# Patient Record
Sex: Male | Born: 1960
Health system: Southern US, Community
[De-identification: ages and names within clinical notes are randomized; demographics above are authoritative.]

## PROBLEM LIST (undated history)

## (undated) DIAGNOSIS — R011 Cardiac murmur, unspecified: Secondary | ICD-10-CM

## (undated) DIAGNOSIS — I10 Essential (primary) hypertension: Secondary | ICD-10-CM

## (undated) DIAGNOSIS — G709 Myoneural disorder, unspecified: Secondary | ICD-10-CM

## (undated) DIAGNOSIS — N529 Male erectile dysfunction, unspecified: Secondary | ICD-10-CM

## (undated) DIAGNOSIS — C801 Malignant (primary) neoplasm, unspecified: Secondary | ICD-10-CM

## (undated) DIAGNOSIS — R350 Frequency of micturition: Secondary | ICD-10-CM

## (undated) DIAGNOSIS — S83207A Unspecified tear of unspecified meniscus, current injury, left knee, initial encounter: Secondary | ICD-10-CM

## (undated) DIAGNOSIS — R351 Nocturia: Secondary | ICD-10-CM

## (undated) DIAGNOSIS — K649 Unspecified hemorrhoids: Secondary | ICD-10-CM

## (undated) DIAGNOSIS — E7800 Pure hypercholesterolemia, unspecified: Secondary | ICD-10-CM

## (undated) HISTORY — DX: Pure hypercholesterolemia, unspecified: E78.00

## (undated) HISTORY — PX: TONSILLECTOMY: SUR1361

## (undated) HISTORY — PX: TAYLOR BUNIONECTOMY: SHX2485

---
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
## 2016-02-14 DIAGNOSIS — E785 Hyperlipidemia, unspecified: Secondary | ICD-10-CM | POA: Diagnosis not present

## 2016-02-14 DIAGNOSIS — R972 Elevated prostate specific antigen [PSA]: Secondary | ICD-10-CM | POA: Diagnosis not present

## 2016-03-24 DIAGNOSIS — K080 Exfoliation of teeth due to systemic causes: Secondary | ICD-10-CM | POA: Diagnosis not present

## 2016-04-21 DIAGNOSIS — R972 Elevated prostate specific antigen [PSA]: Secondary | ICD-10-CM | POA: Diagnosis not present

## 2016-06-01 DIAGNOSIS — Z8546 Personal history of malignant neoplasm of prostate: Secondary | ICD-10-CM

## 2016-06-01 HISTORY — DX: Personal history of malignant neoplasm of prostate: Z85.46

## 2016-06-02 DIAGNOSIS — R972 Elevated prostate specific antigen [PSA]: Secondary | ICD-10-CM | POA: Diagnosis not present

## 2016-06-02 DIAGNOSIS — C61 Malignant neoplasm of prostate: Secondary | ICD-10-CM | POA: Diagnosis not present

## 2016-06-12 DIAGNOSIS — C61 Malignant neoplasm of prostate: Secondary | ICD-10-CM | POA: Diagnosis not present

## 2016-06-12 DIAGNOSIS — R972 Elevated prostate specific antigen [PSA]: Secondary | ICD-10-CM | POA: Diagnosis not present

## 2016-06-19 ENCOUNTER — Other Ambulatory Visit: Payer: Self-pay | Admitting: Urology

## 2016-06-19 NOTE — Progress Notes (Signed)
Scheduling pre op- please place SURGICAL ORDERS in epic  thanks

## 2016-06-21 MED ORDER — MAGNESIUM CITRATE PO SOLN: 1.0000 | Freq: Once | ORAL | Status: AC

## 2016-07-29 ENCOUNTER — Encounter (HOSPITAL_COMMUNITY)
Admission: RE | Admit: 2016-07-29 | Discharge: 2016-07-29 | Disposition: A | Discharge status: Home / Self Care | Payer: Federal, State, Local not specified - PPO | Source: Ambulatory Visit | Attending: Urology | Admitting: Urology

## 2016-07-29 ENCOUNTER — Encounter (HOSPITAL_COMMUNITY): Payer: Self-pay

## 2016-07-29 DIAGNOSIS — I10 Essential (primary) hypertension: Secondary | ICD-10-CM | POA: Diagnosis not present

## 2016-07-29 DIAGNOSIS — M7989 Other specified soft tissue disorders: Secondary | ICD-10-CM | POA: Diagnosis not present

## 2016-07-29 DIAGNOSIS — M6281 Muscle weakness (generalized): Secondary | ICD-10-CM | POA: Diagnosis not present

## 2016-07-29 DIAGNOSIS — C61 Malignant neoplasm of prostate: Secondary | ICD-10-CM | POA: Diagnosis not present

## 2016-07-29 DIAGNOSIS — Z8042 Family history of malignant neoplasm of prostate: Secondary | ICD-10-CM | POA: Diagnosis not present

## 2016-07-29 HISTORY — DX: Cardiac murmur, unspecified: R01.1

## 2016-07-29 HISTORY — DX: Essential (primary) hypertension: I10

## 2016-07-29 HISTORY — DX: Malignant (primary) neoplasm, unspecified: C80.1

## 2016-07-29 HISTORY — DX: Frequency of micturition: R35.0

## 2016-07-29 HISTORY — DX: Unspecified hemorrhoids: K64.9

## 2016-07-29 HISTORY — DX: Myoneural disorder, unspecified: G70.9

## 2016-07-29 LAB — BASIC METABOLIC PANEL
Anion gap: 7 (ref 5–15)
BUN: 18 mg/dL (ref 6–20)
CHLORIDE: 103 mmol/L (ref 101–111)
CO2: 29 mmol/L (ref 22–32)
CREATININE: 1.32 mg/dL — AB (ref 0.61–1.24)
Calcium: 9.5 mg/dL (ref 8.9–10.3)
GFR calc Af Amer: >60 mL/min (ref >60)
GFR calc non Af Amer: 60 mL/min — ABNORMAL LOW (ref >60)
GLUCOSE: 74 mg/dL (ref 65–99)
POTASSIUM: 3.2 mmol/L — AB (ref 3.5–5.1)
SODIUM: 139 mmol/L (ref 135–145)

## 2016-07-29 LAB — CBC
HEMATOCRIT: 41.6 % (ref 39.0–52.0)
Hemoglobin: 14.5 g/dL (ref 13.0–17.0)
MCH: 29.7 pg (ref 26.0–34.0)
MCHC: 34.9 g/dL (ref 30.0–36.0)
MCV: 85.2 fL (ref 78.0–100.0)
PLATELETS: 191 10*3/uL (ref 150–400)
RBC: 4.88 MIL/uL (ref 4.22–5.81)
RDW: 12.9 % (ref 11.5–15.5)
WBC: 7.3 10*3/uL (ref 4.0–10.5)

## 2016-07-29 LAB — ABO/RH: ABO/RH(D): O POS

## 2016-07-29 NOTE — Patient Instructions (Addendum)
Patrick Edwards  07/29/2016   Your procedure is scheduled on: 07-31-16  Report to [facility] Main  Entrance take [facility]  elevators to 3rd floor to  [place] at   0900 AM.  Call this number if you have problems the morning of surgery [phone]  Follow Bowel prep per MD instructions- Drink Clear liquids plentiful day of prep.   Remember: ONLY 1 PERSON MAY GO WITH YOU TO SHORT STAY TO GET  READY MORNING OF YOUR SURGERY.  Do not eat food or drink liquids :After Midnight.     Take these medicines the morning of surgery with A SIP OF WATER: none DO NOT TAKE ANY DIABETIC MEDICATIONS DAY OF YOUR SURGERY                               You may not have any metal on your body including hair pins and              piercings  Do not wear jewelry, make-up, lotions, powders or perfumes, deodorant             Do not wear nail polish.  Do not shave  48 hours prior to surgery.              Men may shave face and neck.   Do not bring valuables to the hospital. [place].  Contacts, dentures or bridgework may not be worn into surgery.  Leave suitcase in the car. After surgery it may be brought to your room.     Patients discharged the day of surgery will not be allowed to drive home.  Name and phone number of your driver: [phone]  Special Instructions: N/A              Please read over the following fact sheets you were given: _____________________________________________________________________             [facility] - Preparing for Surgery Before surgery, you can play an important role.  Because skin is not sterile, your skin needs to be as free of germs as possible.  You can reduce the number of germs on your skin by washing with CHG (chlorahexidine gluconate) soap before surgery.  CHG is an antiseptic cleaner which kills germs and bonds with the skin to continue killing germs even after  washing. Please DO NOT use if you have an allergy to CHG or antibacterial soaps.  If your skin becomes reddened/irritated stop using the CHG and inform your nurse when you arrive at Short Stay. Do not shave (including legs and underarms) for at least 48 hours prior to the first CHG shower.  You may shave your face/neck. Please follow these instructions carefully:  1.  Shower with CHG Soap the night before surgery and the  morning of Surgery.  2.  If you choose to wash your hair, wash your hair first as usual with your  normal  shampoo.  3.  After you shampoo, rinse your hair and body thoroughly to remove the  shampoo.                           4.  Use CHG as you  would any other liquid soap.  You can apply chg directly  to the skin and wash                       Gently with a scrungie or clean washcloth.  5.  Apply the CHG Soap to your body ONLY FROM THE NECK DOWN.   Do not use on face/ open                           Wound or open sores. Avoid contact with eyes, ears mouth and genitals (private parts).                       Wash face,  Genitals (private parts) with your normal soap.             6.  Wash thoroughly, paying special attention to the area where your surgery  will be performed.  7.  Thoroughly rinse your body with warm water from the neck down.  8.  DO NOT shower/wash with your normal soap after using and rinsing off  the CHG Soap.                9.  Pat yourself dry with a clean towel.            10.  Wear clean pajamas.            11.  Place clean sheets on your bed the night of your first shower and do not  sleep with pets. Day of Surgery : Do not apply any lotions/deodorants the morning of surgery.  Please wear clean clothes to the hospital/surgery center.  FAILURE TO FOLLOW THESE INSTRUCTIONS MAY RESULT IN THE CANCELLATION OF YOUR SURGERY PATIENT SIGNATURE_________________________________  NURSE  SIGNATURE__________________________________  ________________________________________________________________________

## 2016-07-29 NOTE — Progress Notes (Signed)
07-29-16 1640 Note BMP, Potassium 3.2- pt taking Hydrochlorthaizide for Blood pressure and will be doing a bowel prep day before surgery.

## 2016-07-30 NOTE — Pre-Procedure Instructions (Signed)
EKG done 07-29-16.

## 2016-07-31 ENCOUNTER — Inpatient Hospital Stay (HOSPITAL_COMMUNITY): Payer: Federal, State, Local not specified - PPO | Admitting: Anesthesiology

## 2016-07-31 ENCOUNTER — Encounter (HOSPITAL_COMMUNITY)
Admission: RE | Disposition: A | Discharge status: Home / Self Care | Payer: Self-pay | Source: Ambulatory Visit | Attending: Urology

## 2016-07-31 ENCOUNTER — Inpatient Hospital Stay (HOSPITAL_COMMUNITY)
Admission: RE | Admit: 2016-07-31 | Discharge: 2016-08-03 | DRG: 708 | Disposition: A | Discharge status: Home / Self Care | Payer: Federal, State, Local not specified - PPO | Source: Ambulatory Visit | Attending: Urology | Admitting: Urology

## 2016-07-31 ENCOUNTER — Encounter (HOSPITAL_COMMUNITY): Payer: Self-pay | Admitting: *Deleted

## 2016-07-31 DIAGNOSIS — I10 Essential (primary) hypertension: Secondary | ICD-10-CM | POA: Diagnosis not present

## 2016-07-31 DIAGNOSIS — Z8042 Family history of malignant neoplasm of prostate: Secondary | ICD-10-CM

## 2016-07-31 DIAGNOSIS — C61 Malignant neoplasm of prostate: Secondary | ICD-10-CM | POA: Diagnosis not present

## 2016-07-31 DIAGNOSIS — M7989 Other specified soft tissue disorders: Secondary | ICD-10-CM | POA: Diagnosis not present

## 2016-07-31 HISTORY — PX: LYMPHADENECTOMY: SHX5960

## 2016-07-31 HISTORY — PX: ROBOT ASSISTED LAPAROSCOPIC RADICAL PROSTATECTOMY: SHX5141

## 2016-07-31 LAB — TYPE AND SCREEN
ABO/RH(D): O POS
Antibody Screen: NEGATIVE

## 2016-07-31 LAB — HEMOGLOBIN AND HEMATOCRIT, BLOOD
HEMATOCRIT: 42.4 % (ref 39.0–52.0)
HEMOGLOBIN: 14.2 g/dL (ref 13.0–17.0)

## 2016-07-31 SURGERY — PROSTATECTOMY, RADICAL, ROBOT-ASSISTED, LAPAROSCOPIC
Anesthesia: General

## 2016-07-31 MED ORDER — MIDAZOLAM HCL 2 MG/2ML IJ SOLN
INTRAMUSCULAR | Status: DC | PRN
  Administered 2016-07-31: 2 mg via INTRAVENOUS

## 2016-07-31 MED ORDER — HYDROCHLOROTHIAZIDE 25 MG PO TABS
25.0000 mg | ORAL_TABLET | Freq: Every day | ORAL | Status: DC
Start: 2016-07-31 — End: 2016-08-03
  Administered 2016-07-31 – 2016-08-03 (×4): 25 mg via ORAL
  Filled 2016-07-31 (×4): qty 1

## 2016-07-31 MED ORDER — METOCLOPRAMIDE HCL 5 MG/ML IJ SOLN: 10.0000 mg | Freq: Once | INTRAMUSCULAR | Status: DC | PRN

## 2016-07-31 MED ORDER — SODIUM CHLORIDE 0.9 % IR SOLN
Status: DC | PRN
  Administered 2016-07-31: 1000 mL

## 2016-07-31 MED ORDER — ACETAMINOPHEN 500 MG PO TABS
1000.0000 mg | ORAL_TABLET | Freq: Four times a day (QID) | ORAL | Status: AC
  Administered 2016-07-31 – 2016-08-01 (×4): 1000 mg via ORAL
  Filled 2016-07-31 (×4): qty 2

## 2016-07-31 MED ORDER — ONDANSETRON HCL 4 MG/2ML IJ SOLN
INTRAMUSCULAR | Status: DC | PRN
  Administered 2016-07-31: 4 mg via INTRAVENOUS

## 2016-07-31 MED ORDER — ROCURONIUM BROMIDE 50 MG/5ML IV SOSY
PREFILLED_SYRINGE | INTRAVENOUS | Status: AC
  Filled 2016-07-31: qty 5

## 2016-07-31 MED ORDER — DEXAMETHASONE SODIUM PHOSPHATE 10 MG/ML IJ SOLN
INTRAMUSCULAR | Status: DC | PRN
  Administered 2016-07-31: 10 mg via INTRAVENOUS

## 2016-07-31 MED ORDER — SUCCINYLCHOLINE CHLORIDE 200 MG/10ML IV SOSY
PREFILLED_SYRINGE | INTRAVENOUS | Status: AC
  Filled 2016-07-31: qty 10

## 2016-07-31 MED ORDER — SODIUM CHLORIDE 0.9 % IJ SOLN
INTRAMUSCULAR | Status: AC
  Filled 2016-07-31: qty 50

## 2016-07-31 MED ORDER — ACETAMINOPHEN 10 MG/ML IV SOLN
1000.0000 mg | Freq: Once | INTRAVENOUS | Status: AC
  Administered 2016-07-31: 1000 mg via INTRAVENOUS

## 2016-07-31 MED ORDER — HYDROMORPHONE HCL 1 MG/ML IJ SOLN
0.5000 mg | INTRAMUSCULAR | Status: DC | PRN
Start: 2016-07-31 — End: 2016-08-03
  Administered 2016-07-31: 0.5 mg via INTRAVENOUS
  Administered 2016-07-31 – 2016-08-01 (×6): 1 mg via INTRAVENOUS
  Administered 2016-08-02: 0.5 mg via INTRAVENOUS
  Filled 2016-07-31 (×5): qty 1
  Filled 2016-07-31: qty 0.5
  Filled 2016-07-31 (×3): qty 1

## 2016-07-31 MED ORDER — MIDAZOLAM HCL 2 MG/2ML IJ SOLN
INTRAMUSCULAR | Status: AC
  Filled 2016-07-31: qty 2

## 2016-07-31 MED ORDER — SUCCINYLCHOLINE CHLORIDE 200 MG/10ML IV SOSY
PREFILLED_SYRINGE | INTRAVENOUS | Status: DC | PRN
  Administered 2016-07-31: 120 mg via INTRAVENOUS

## 2016-07-31 MED ORDER — DEXTROSE-NACL 5-0.45 % IV SOLN
INTRAVENOUS | Status: DC
Start: 2016-07-31 — End: 2016-08-01
  Administered 2016-07-31 – 2016-08-01 (×2): via INTRAVENOUS

## 2016-07-31 MED ORDER — EPHEDRINE SULFATE 50 MG/ML IJ SOLN
INTRAMUSCULAR | Status: DC | PRN
  Administered 2016-07-31 (×2): 10 mg via INTRAVENOUS

## 2016-07-31 MED ORDER — SUGAMMADEX SODIUM 200 MG/2ML IV SOLN
INTRAVENOUS | Status: DC | PRN
  Administered 2016-07-31: 200 mg via INTRAVENOUS

## 2016-07-31 MED ORDER — PROPOFOL 10 MG/ML IV BOLUS
INTRAVENOUS | Status: DC | PRN
  Administered 2016-07-31: 150 mg via INTRAVENOUS

## 2016-07-31 MED ORDER — DEXAMETHASONE SODIUM PHOSPHATE 10 MG/ML IJ SOLN
INTRAMUSCULAR | Status: AC
  Filled 2016-07-31: qty 1

## 2016-07-31 MED ORDER — LACTATED RINGERS IV SOLN: INTRAVENOUS | Status: DC

## 2016-07-31 MED ORDER — ONDANSETRON HCL 4 MG/2ML IJ SOLN
INTRAMUSCULAR | Status: AC
  Filled 2016-07-31: qty 2

## 2016-07-31 MED ORDER — PHENYLEPHRINE 40 MCG/ML (10ML) SYRINGE FOR IV PUSH (FOR BLOOD PRESSURE SUPPORT)
PREFILLED_SYRINGE | INTRAVENOUS | Status: AC
  Filled 2016-07-31: qty 10

## 2016-07-31 MED ORDER — SUGAMMADEX SODIUM 200 MG/2ML IV SOLN
INTRAVENOUS | Status: AC
  Filled 2016-07-31: qty 2

## 2016-07-31 MED ORDER — LIDOCAINE 2% (20 MG/ML) 5 ML SYRINGE
INTRAMUSCULAR | Status: AC
  Filled 2016-07-31: qty 5

## 2016-07-31 MED ORDER — ACETAMINOPHEN 10 MG/ML IV SOLN
INTRAVENOUS | Status: AC
  Filled 2016-07-31: qty 100

## 2016-07-31 MED ORDER — FENTANYL CITRATE (PF) 100 MCG/2ML IJ SOLN
INTRAMUSCULAR | Status: AC
  Filled 2016-07-31: qty 2

## 2016-07-31 MED ORDER — FENTANYL CITRATE (PF) 250 MCG/5ML IJ SOLN
INTRAMUSCULAR | Status: AC
  Filled 2016-07-31: qty 5

## 2016-07-31 MED ORDER — LACTATED RINGERS IV SOLN
INTRAVENOUS | Status: DC
  Administered 2016-07-31: 1000 mL via INTRAVENOUS
  Administered 2016-07-31 (×2): via INTRAVENOUS

## 2016-07-31 MED ORDER — SODIUM CHLORIDE 0.9 % IJ SOLN
INTRAMUSCULAR | Status: DC | PRN
  Administered 2016-07-31: 40 mL

## 2016-07-31 MED ORDER — BUPIVACAINE LIPOSOME 1.3 % IJ SUSP
INTRAMUSCULAR | Status: DC | PRN
  Administered 2016-07-31: 20 mL

## 2016-07-31 MED ORDER — CEFAZOLIN SODIUM-DEXTROSE 2-4 GM/100ML-% IV SOLN
2.0000 g | INTRAVENOUS | Status: AC
  Administered 2016-07-31: 2 g via INTRAVENOUS
  Filled 2016-07-31: qty 100

## 2016-07-31 MED ORDER — DIPHENHYDRAMINE HCL 50 MG/ML IJ SOLN: 12.5000 mg | Freq: Four times a day (QID) | INTRAMUSCULAR | Status: DC | PRN

## 2016-07-31 MED ORDER — OXYCODONE HCL 5 MG PO TABS
5.0000 mg | ORAL_TABLET | ORAL | Status: DC | PRN
  Administered 2016-08-01 – 2016-08-03 (×8): 5 mg via ORAL
  Filled 2016-07-31 (×8): qty 1

## 2016-07-31 MED ORDER — SODIUM CHLORIDE 0.9 % IV BOLUS (SEPSIS)
1000.0000 mL | Freq: Once | INTRAVENOUS | Status: AC
  Administered 2016-07-31: 1000 mL via INTRAVENOUS

## 2016-07-31 MED ORDER — MEPERIDINE HCL 50 MG/ML IJ SOLN: 6.2500 mg | INTRAMUSCULAR | Status: DC | PRN

## 2016-07-31 MED ORDER — DIPHENHYDRAMINE HCL 12.5 MG/5ML PO ELIX
12.5000 mg | ORAL_SOLUTION | Freq: Four times a day (QID) | ORAL | Status: DC | PRN
Start: 2016-07-31 — End: 2016-08-03

## 2016-07-31 MED ORDER — ROCURONIUM BROMIDE 50 MG/5ML IV SOSY
PREFILLED_SYRINGE | INTRAVENOUS | Status: DC | PRN
  Administered 2016-07-31 (×6): 10 mg via INTRAVENOUS
  Administered 2016-07-31: 40 mg via INTRAVENOUS
  Administered 2016-07-31: 10 mg via INTRAVENOUS

## 2016-07-31 MED ORDER — SULFAMETHOXAZOLE-TRIMETHOPRIM 800-160 MG PO TABS: 1.0000 | ORAL_TABLET | Freq: Two times a day (BID) | ORAL | 0 refills | Status: DC

## 2016-07-31 MED ORDER — LIDOCAINE 2% (20 MG/ML) 5 ML SYRINGE
INTRAMUSCULAR | Status: DC | PRN
  Administered 2016-07-31: 100 mg via INTRAVENOUS

## 2016-07-31 MED ORDER — FENTANYL CITRATE (PF) 100 MCG/2ML IJ SOLN
25.0000 ug | INTRAMUSCULAR | Status: DC | PRN
  Administered 2016-07-31 (×2): 25 ug via INTRAVENOUS
  Administered 2016-07-31: 50 ug via INTRAVENOUS

## 2016-07-31 MED ORDER — HYDROCODONE-ACETAMINOPHEN 5-325 MG PO TABS: 1.0000 | ORAL_TABLET | Freq: Four times a day (QID) | ORAL | 0 refills | Status: DC | PRN

## 2016-07-31 MED ORDER — FENTANYL CITRATE (PF) 250 MCG/5ML IJ SOLN
INTRAMUSCULAR | Status: DC | PRN
  Administered 2016-07-31 (×5): 50 ug via INTRAVENOUS

## 2016-07-31 MED ORDER — PROPOFOL 10 MG/ML IV BOLUS
INTRAVENOUS | Status: AC
  Filled 2016-07-31: qty 20

## 2016-07-31 MED ORDER — CEFAZOLIN SODIUM-DEXTROSE 2-4 GM/100ML-% IV SOLN
INTRAVENOUS | Status: AC
  Filled 2016-07-31: qty 100

## 2016-07-31 MED ORDER — EPHEDRINE 5 MG/ML INJ
INTRAVENOUS | Status: AC
  Filled 2016-07-31: qty 10

## 2016-07-31 MED ORDER — ONDANSETRON HCL 4 MG/2ML IJ SOLN
4.0000 mg | INTRAMUSCULAR | Status: DC | PRN
  Administered 2016-08-01: 4 mg via INTRAVENOUS
  Filled 2016-07-31: qty 2

## 2016-07-31 MED ORDER — BUPIVACAINE LIPOSOME 1.3 % IJ SUSP
INTRAMUSCULAR | Status: AC
  Filled 2016-07-31: qty 20

## 2016-07-31 MED ORDER — INFLUENZA VAC SPLIT QUAD 0.5 ML IM SUSY
0.5000 mL | PREFILLED_SYRINGE | INTRAMUSCULAR | Status: AC
  Administered 2016-08-01: 0.5 mL via INTRAMUSCULAR
  Filled 2016-07-31: qty 0.5

## 2016-07-31 SURGICAL SUPPLY — 65 items
APPLICATOR COTTON TIP 6IN STRL (MISCELLANEOUS) ×3 IMPLANT
CATH FOLEY 2WAY SLVR  5CC 18FR (CATHETERS) ×1
CATH FOLEY 2WAY SLVR 18FR 30CC (CATHETERS) ×3 IMPLANT
CATH FOLEY 2WAY SLVR 5CC 18FR (CATHETERS) ×2 IMPLANT
CATH TIEMANN FOLEY 18FR 5CC (CATHETERS) ×3 IMPLANT
CHLORAPREP W/TINT 26ML (MISCELLANEOUS) ×3 IMPLANT
CLIP LIGATING HEM O LOK PURPLE (MISCELLANEOUS) ×12 IMPLANT
CLOTH BEACON ORANGE TIMEOUT ST (SAFETY) ×3 IMPLANT
CONT SPECI 4OZ STER CLIK (MISCELLANEOUS) ×3 IMPLANT
COVER SURGICAL LIGHT HANDLE (MISCELLANEOUS) ×3 IMPLANT
COVER TIP SHEARS 8 DVNC (MISCELLANEOUS) ×2 IMPLANT
COVER TIP SHEARS 8MM DA VINCI (MISCELLANEOUS) ×1
CUTTER ECHEON FLEX ENDO 45 340 (ENDOMECHANICALS) ×3 IMPLANT
DECANTER SPIKE VIAL GLASS SM (MISCELLANEOUS) ×3 IMPLANT
DERMABOND ADVANCED (GAUZE/BANDAGES/DRESSINGS) ×1
DERMABOND ADVANCED .7 DNX12 (GAUZE/BANDAGES/DRESSINGS) ×2 IMPLANT
DRAPE ARM DVNC X/XI (DISPOSABLE) ×8 IMPLANT
DRAPE COLUMN DVNC XI (DISPOSABLE) ×2 IMPLANT
DRAPE DA VINCI XI ARM (DISPOSABLE) ×4
DRAPE DA VINCI XI COLUMN (DISPOSABLE) ×1
DRAPE SURG IRRIG POUCH 19X23 (DRAPES) ×3 IMPLANT
DRSG TEGADERM 4X4.75 (GAUZE/BANDAGES/DRESSINGS) ×3 IMPLANT
DRSG TEGADERM 6X8 (GAUZE/BANDAGES/DRESSINGS) ×3 IMPLANT
ELECT REM PT RETURN 9FT ADLT (ELECTROSURGICAL) ×3
ELECTRODE REM PT RTRN 9FT ADLT (ELECTROSURGICAL) ×2 IMPLANT
GAUZE SPONGE 2X2 8PLY STRL LF (GAUZE/BANDAGES/DRESSINGS) ×2 IMPLANT
GLOVE BIO SURGEON STRL SZ 6.5 (GLOVE) ×3 IMPLANT
GLOVE BIOGEL M STRL SZ7.5 (GLOVE) ×6 IMPLANT
GLOVE BIOGEL PI IND STRL 7.5 (GLOVE) ×2 IMPLANT
GLOVE BIOGEL PI INDICATOR 7.5 (GLOVE) ×1
GOWN STRL REUS W/TWL LRG LVL3 (GOWN DISPOSABLE) ×6 IMPLANT
GOWN STRL REUS W/TWL LRG LVL4 (GOWN DISPOSABLE) ×9 IMPLANT
HOLDER FOLEY CATH W/STRAP (MISCELLANEOUS) ×3 IMPLANT
IRRIG SUCT STRYKERFLOW 2 WTIP (MISCELLANEOUS) ×3
IRRIGATION SUCT STRKRFLW 2 WTP (MISCELLANEOUS) ×2 IMPLANT
IV LACTATED RINGERS 1000ML (IV SOLUTION) IMPLANT
KIT PROCEDURE DA VINCI SI (MISCELLANEOUS) ×1
KIT PROCEDURE DVNC SI (MISCELLANEOUS) ×2 IMPLANT
NEEDLE INSUFFLATION 14GA 120MM (NEEDLE) ×3 IMPLANT
NEEDLE SPNL 22GX7 QUINCKE BK (NEEDLE) ×3 IMPLANT
PACK ROBOT UROLOGY CUSTOM (CUSTOM PROCEDURE TRAY) ×3 IMPLANT
PAD POSITIONING PINK XL (MISCELLANEOUS) IMPLANT
PLUG CATH AND CAP STER (CATHETERS) ×3 IMPLANT
PORT ACCESS TROCAR AIRSEAL 12 (TROCAR) ×2 IMPLANT
PORT ACCESS TROCAR AIRSEAL 5M (TROCAR) ×1
RELOAD GREEN ECHELON 45 (STAPLE) ×3 IMPLANT
SEAL CANN UNIV 5-8 DVNC XI (MISCELLANEOUS) ×8 IMPLANT
SEAL XI 5MM-8MM UNIVERSAL (MISCELLANEOUS) ×4
SET TRI-LUMEN FLTR TB AIRSEAL (TUBING) ×3 IMPLANT
SHEET LAVH (DRAPES) ×3 IMPLANT
SOLUTION ELECTROLUBE (MISCELLANEOUS) ×3 IMPLANT
SPONGE GAUZE 2X2 STER 10/PKG (GAUZE/BANDAGES/DRESSINGS) ×1
SPONGE LAP 4X18 X RAY DECT (DISPOSABLE) ×3 IMPLANT
SUT ETHILON 3 0 PS 1 (SUTURE) ×3 IMPLANT
SUT MNCRL AB 4-0 PS2 18 (SUTURE) ×6 IMPLANT
SUT PDS AB 1 CT1 27 (SUTURE) ×6 IMPLANT
SUT VIC AB 2-0 SH 27 (SUTURE) ×1
SUT VIC AB 2-0 SH 27X BRD (SUTURE) ×2 IMPLANT
SUT VICRYL 0 UR6 27IN ABS (SUTURE) ×3 IMPLANT
SUT VLOC BARB 180 ABS3/0GR12 (SUTURE) ×9
SUTURE VLOC BRB 180 ABS3/0GR12 (SUTURE) ×6 IMPLANT
SYR 27GX1/2 1ML LL SAFETY (SYRINGE) ×3 IMPLANT
TOWEL OR 17X26 10 PK STRL BLUE (TOWEL DISPOSABLE) ×3 IMPLANT
TOWEL OR NON WOVEN STRL DISP B (DISPOSABLE) ×3 IMPLANT
WATER STERILE IRR 1500ML POUR (IV SOLUTION) ×6 IMPLANT

## 2016-07-31 NOTE — Transfer of Care (Signed)
Immediate Anesthesia Transfer of Care Note  Patient: Patrick Edwards  Procedure(s) Performed: Procedure(s): XI ROBOTIC ASSISTED LAPAROSCOPIC RADICAL PROSTATECTOMY WITH INDOCYANINE GREEN DYE INJECTION (N/A) PELVIC LYMPHADENECTOMY (Bilateral)  Patient Location: PACU  Anesthesia Type:General  Level of Consciousness:  sedated, patient cooperative and responds to stimulation  Airway & Oxygen Therapy:Patient Spontanous Breathing and Patient connected to face mask oxgen  Post-op Assessment:  Report given to PACU RN and Post -op Vital signs reviewed and stable  Post vital signs:  Reviewed and stable  Last Vitals:  Vitals:   07/31/16 0849 07/31/16 0913  BP: (!) 179/99 (!) 128/92  Pulse: (!) 52   Resp: 18   Temp: 123XX123 C     Complications: No apparent anesthesia complications

## 2016-07-31 NOTE — Anesthesia Procedure Notes (Signed)
Procedure Name: Intubation Date/Time: 07/31/2016 12:10 PM Performed by: Dione Booze Pre-anesthesia Checklist: Emergency Drugs available, Suction available, Patient being monitored and Patient identified Patient Re-evaluated:Patient Re-evaluated prior to inductionOxygen Delivery Method: Circle system utilized Preoxygenation: Pre-oxygenation with 100% oxygen Intubation Type: IV induction Laryngoscope Size: Mac and 4 Grade View: Grade I Tube type: Oral Tube size: 7.5 mm Number of attempts: 1 Airway Equipment and Method: Stylet Placement Confirmation: ETT inserted through vocal cords under direct vision,  positive ETCO2 and breath sounds checked- equal and bilateral Secured at: 22 cm Tube secured with: Tape Dental Injury: Teeth and Oropharynx as per pre-operative assessment

## 2016-07-31 NOTE — H&P (Signed)
Patrick Edwards is an 55 y.o. male.    Chief Complaint: Pre-op Robotic Prostatectomy  HPI:   1 - Large Volume Moderate Risk Prostate Cancer - 9/12 cores positive by biopsy 06/2016 on eval of rising PSA to 3.4. Pt's father and grandfather with prostate cancer. Gleason 3+4=7 in LLA, LLM, RLM, 3+3=6 in LLB, LLM, LMA, RMM, RMA, RLA. All apical cores positive, Most lateral cores positive, Less disease at base. Vol 78m without median lobe.    PMH sig for Bell's palsey, TNA. No CV disease / blood thinners. He runs marathons. His PCP is Dibas Koirala MD with [facility]   Today " [name]" is seen to proceed with prostatectomy as primary therapy for his cancer.   Past Medical History:  Diagnosis Date  . Cancer ([facility]    Prostate cancer, dx. with biopsy 06-02-16.  .Marland KitchenHeart murmur   . Hemorrhoids    no issue now  . Hypertension   . Increased urinary frequency    x 2-3 nightly  . [place]romuscular disorder ([facility]    Bells Palsy '82-  left side facial weakness"slight"    Past Surgical History:  Procedure Laterality Date  . TAYLOR BUNIONECTOMY Bilateral    '79  . TONSILLECTOMY      No family history on file. Social History:  reports that he has never smoked. He has never used smokeless tobacco. He reports that he does not drink alcohol or use drugs.  Allergies: No Known Allergies  No prescriptions prior to admission.    Results for orders placed or performed during the hospital encounter of 07/29/16 (from the past 48 hour(s))  Basic metabolic panel     Status: Abnormal   Collection Time: 07/29/16  3:10 PM  Result Value Ref Range   Sodium 139 135 - 145 mmol/L   Potassium 3.2 (L) 3.5 - 5.1 mmol/L   Chloride 103 101 - 111 mmol/L   CO2 29 22 - 32 mmol/L   Glucose, Bld 74 65 - 99 mg/dL   BUN 18 6 - 20 mg/dL   Creatinine, Ser 1.32 (H) 0.61 - 1.24 mg/dL   Calcium 9.5 8.9 - 10.3 mg/dL   GFR calc non Af Amer 60 (L) >60 mL/min   GFR calc Af Amer >60 >60 mL/min    Comment: (NOTE) The eGFR has  been calculated using the CKD EPI equation. This calculation has not been validated in all clinical situations. eGFR's persistently <60 mL/min signify possible Chronic Kidney Disease.    Anion gap 7 5 - 15  CBC     Status: None   Collection Time: 07/29/16  3:10 PM  Result Value Ref Range   WBC 7.3 4.0 - 10.5 K/uL   RBC 4.88 4.22 - 5.81 MIL/uL   Hemoglobin 14.5 13.0 - 17.0 g/dL   HCT 41.6 39.0 - 52.0 %   MCV 85.2 78.0 - 100.0 fL   MCH 29.7 26.0 - 34.0 pg   MCHC 34.9 30.0 - 36.0 g/dL   RDW 12.9 11.5 - 15.5 %   Platelets 191 150 - 400 K/uL  Type and screen All Cardiac and thoracic surgeries, spinal fusions, myomectomies, craniotomies, colon & liver resections, total joint revisions, same day c-section with placenta previa or accreta.     Status: None   Collection Time: 07/29/16  3:10 PM  Result Value Ref Range   ABO/RH(D) O POS    Antibody Screen NEG    Sample Expiration 08/12/2016    Extend sample reason NO TRANSFUSIONS OR  PREGNANCY IN THE PAST 3 MONTHS   ABO/Rh     Status: None   Collection Time: 07/29/16  3:10 PM  Result Value Ref Range   ABO/RH(D) O POS    No results found.  Review of Systems  Constitutional: Negative.   HENT: Negative.   Eyes: Negative.   Respiratory: Negative.   Cardiovascular: Negative.   Gastrointestinal: Negative.   Genitourinary: Negative.   Musculoskeletal: Negative.   Skin: Negative.   Neurological: Negative.   Endo/Heme/Allergies: Negative.   Psychiatric/Behavioral: Negative.     There were no vitals taken for this visit. Physical Exam  Constitutional: He is oriented to person, place, and time. He appears well-developed.  HENT:  Head: Normocephalic.  Eyes: Pupils are equal, round, and reactive to light.  Neck: Normal range of motion.  Cardiovascular: Normal rate.   Respiratory: Effort normal.  GI: Soft.  Genitourinary:  Genitourinary Comments: No CVAT  Musculoskeletal: Normal range of motion.  Neurological: He is alert and  oriented to person, place, and time.  Skin: Skin is warm.  Psychiatric: He has a normal mood and affect. His behavior is normal. Judgment and thought content normal.     Assessment/Plan  Proceed as planned with robotic prostatectomy + ICG + nodes. Risks, benefits, alternatives, expected peri-op course discussed previously and reiterated today.   Alexis Frock, MD 07/31/2016, 6:28 AM

## 2016-07-31 NOTE — Anesthesia Postprocedure Evaluation (Signed)
Anesthesia Post Note  Patient: Patrick Edwards  Procedure(s) Performed: Procedure(s) (LRB): XI ROBOTIC ASSISTED LAPAROSCOPIC RADICAL PROSTATECTOMY WITH INDOCYANINE GREEN DYE INJECTION (N/A) PELVIC LYMPHADENECTOMY (Bilateral)  Patient location during evaluation: PACU Anesthesia Type: General Level of consciousness: sedated and patient cooperative Pain management: pain level controlled Vital Signs Assessment: post-procedure vital signs reviewed and stable Respiratory status: spontaneous breathing Cardiovascular status: stable Anesthetic complications: no    Last Vitals:  Vitals:   07/31/16 1718 07/31/16 1731  BP: (!) 172/97 (!) 158/89  Pulse: 64   Resp: 18   Temp: 36.8 C     Last Pain:  Vitals:   07/31/16 1721  TempSrc:   PainSc: 2                  Nolon Nations

## 2016-07-31 NOTE — Brief Op Note (Signed)
07/31/2016  3:33 PM  PATIENT:  Patrick Edwards  55 y.o. male  PRE-OPERATIVE DIAGNOSIS:  PROSTATE CANCER  POST-OPERATIVE DIAGNOSIS:  PROSTATE CANCER  PROCEDURE:  Procedure(s): XI ROBOTIC ASSISTED LAPAROSCOPIC RADICAL PROSTATECTOMY WITH INDOCYANINE GREEN DYE INJECTION (N/A) PELVIC LYMPHADENECTOMY (Bilateral)  SURGEON:  Surgeon(s) and Role:    * Alexis Frock, MD - Primary  PHYSICIAN ASSISTANT:   ASSISTANTS: Debbrah Alar PA   ANESTHESIA:   local and general  EBL:  Total I/O In: 2000 [I.V.:2000] Out: 150 [Blood:150]  BLOOD ADMINISTERED:none  DRAINS: 1 - JP to bulb, 2 - Foley to gravity   LOCAL MEDICATIONS USED:  MARCAINE     SPECIMEN:  Source of Specimen:  1 - radical prostatectomy 2- revised bladder neck margin 3- anterior and posterior bladder neck margins (rozen), 4 - pelvic lymph nodes  DISPOSITION OF SPECIMEN:  PATHOLOGY  COUNTS:  YES  TOURNIQUET:  * No tourniquets in log *  DICTATION: .Other Dictation: Dictation Number [id number]  PLAN OF CARE: Admit to inpatient   PATIENT DISPOSITION:  PACU - hemodynamically stable.   Delay start of Pharmacological VTE agent (>24hrs) due to surgical blood loss or risk of bleeding: yes

## 2016-07-31 NOTE — Anesthesia Preprocedure Evaluation (Addendum)
Anesthesia Evaluation  Patient identified by MRN, date of birth, ID band Patient awake    Reviewed: Allergy & Precautions, NPO status , Patient's Chart, lab work & pertinent test results  Airway Mallampati: II  TM Distance: >3 FB Neck ROM: Full    Dental no notable dental hx.    Pulmonary neg pulmonary ROS,    Pulmonary exam normal breath sounds clear to auscultation       Cardiovascular hypertension, Pt. on medications negative cardio ROS Normal cardiovascular exam Rhythm:Regular Rate:Normal     Neuro/Psych negative neurological ROS  negative psych ROS   GI/Hepatic negative GI ROS, Neg liver ROS,   Endo/Other  negative endocrine ROS  Renal/GU negative Renal ROS  negative genitourinary   Musculoskeletal negative musculoskeletal ROS (+)   Abdominal   Peds negative pediatric ROS (+)  Hematology negative hematology ROS (+)   Anesthesia Other Findings   Reproductive/Obstetrics negative OB ROS                            Anesthesia Physical Anesthesia Plan  ASA: II  Anesthesia Plan: General   Post-op Pain Management:    Induction: Intravenous  Airway Management Planned: Oral ETT  Additional Equipment:   Intra-op Plan:   Post-operative Plan: Extubation in OR  Informed Consent: I have reviewed the patients History and Physical, chart, labs and discussed the procedure including the risks, benefits and alternatives for the proposed anesthesia with the patient or authorized representative who has indicated his/her understanding and acceptance.   Dental advisory given  Plan Discussed with: CRNA  Anesthesia Plan Comments:         Anesthesia Quick Evaluation

## 2016-08-01 ENCOUNTER — Encounter (HOSPITAL_COMMUNITY): Payer: Self-pay | Admitting: Urology

## 2016-08-01 LAB — BASIC METABOLIC PANEL
Anion gap: 6 (ref 5–15)
BUN: 12 mg/dL (ref 6–20)
CALCIUM: 8.1 mg/dL — AB (ref 8.9–10.3)
CHLORIDE: 102 mmol/L (ref 101–111)
CO2: 28 mmol/L (ref 22–32)
CREATININE: 1.33 mg/dL — AB (ref 0.61–1.24)
GFR calc non Af Amer: 59 mL/min — ABNORMAL LOW (ref >60)
GLUCOSE: 145 mg/dL — AB (ref 65–99)
Potassium: 4 mmol/L (ref 3.5–5.1)
Sodium: 136 mmol/L (ref 135–145)

## 2016-08-01 LAB — HEMOGLOBIN AND HEMATOCRIT, BLOOD
HCT: 39.2 % (ref 39.0–52.0)
Hemoglobin: 13.4 g/dL (ref 13.0–17.0)

## 2016-08-01 MED ORDER — ALUM & MAG HYDROXIDE-SIMETH 200-200-20 MG/5ML PO SUSP
30.0000 mL | Freq: Four times a day (QID) | ORAL | Status: DC | PRN
  Administered 2016-08-01: 30 mL via ORAL
  Filled 2016-08-01: qty 30

## 2016-08-01 NOTE — Discharge Instructions (Signed)

## 2016-08-01 NOTE — Progress Notes (Signed)
1 Day Post-Op  Subjective:  1 - Moderate Risk Prostate Cancer - s/p robotic prostatectomy + ICG sentinal / template pelvic lymphadenectomy on 07/31/16. Hgb 13.4, Cr 1.33 POD 1. JP removed POD 1 as output scant. Path pending.   Today "Patrick Edwards" is progressing. No issues overnight. Ambulated x2. JP output minimal. Hgb and Cr acceptable. Pain controlled.   Objective: Vital signs in last 24 hours: Temp:  [98 F (36.7 C)-99.4 F (37.4 C)] 98.6 F (37 C) (12/01 0226) Pulse Rate:  [52-70] 60 (12/01 0226) Resp:  [12-22] 18 (12/01 0226) BP: (124-179)/(64-99) 124/64 (12/01 0226) SpO2:  [94 %-100 %] 97 % (12/01 0226) Weight:  [61.7 kg (136 lb)] 61.7 kg (136 lb) (11/30 0908)    Intake/Output from previous day: 11/30 0701 - 12/01 0700 In: 3445 [P.O.:240; I.V.:2205; IV Piggyback:1000] Out: 1460 [Urine:1200; Drains:110; Blood:150] Intake/Output this shift: Total I/O In: 240 [P.O.:240] Out: 905 [Urine:800; Drains:105]  General appearance: alert, cooperative, appears stated age and family at bedside Eyes: negative Nose: Nares normal. Septum midline. Mucosa normal. No drainage or sinus tenderness. Throat: lips, mucosa, and tongue normal; teeth and gums normal Neck: supple, symmetrical, trachea midline Back: symmetric, no curvature. ROM normal. No CVA tenderness. Resp: non-labored on room air.  Cardio: Nl rate GI: soft, non-tender; bowel sounds normal; no masses,  no organomegaly Male genitalia: normal, foley c/d/i with light pink urine.  Extremities: extremities normal, atraumatic, no cyanosis or edema Pulses: 2+ and symmetric Skin: Skin color, texture, turgor normal. No rashes or lesions Neurologic: Grossly normal Incision/Wound: Recent port / extraction sites c/d/i. JP removed and dry dressing applied.   Lab Results:   Recent Labs  07/29/16 1510 07/31/16 1606 08/01/16 0513  WBC 7.3  --   --   HGB 14.5 14.2 13.4  HCT 41.6 42.4 39.2  PLT 191  --   --    BMET  Recent Labs  07/29/16 1510 08/01/16 0513  NA 139 136  K 3.2* 4.0  CL 103 102  CO2 29 28  GLUCOSE 74 145*  BUN 18 12  CREATININE 1.32* 1.33*  CALCIUM 9.5 8.1*   PT/INR No results for input(s): LABPROT, INR in the last 72 hours. ABG No results for input(s): PHART, HCO3 in the last 72 hours.  Invalid input(s): PCO2, PO2  Studies/Results: No results found.  Anti-infectives: Anti-infectives    Start     Dose/Rate Route Frequency Ordered Stop   07/31/16 0851  ceFAZolin (ANCEF) IVPB 2g/100 mL premix     2 g 200 mL/hr over 30 Minutes Intravenous 30 min pre-op 07/31/16 0851 07/31/16 1218   07/31/16 0000  sulfamethoxazole-trimethoprim (BACTRIM DS,SEPTRA DS) 800-160 MG tablet     1 tablet Oral 2 times daily 07/31/16 1059        Assessment/Plan:  1 - Moderate Risk Prostate Cancer - doing well POD 1. SLIV, Reg Diet, Ambulate. Goals for DC discussed, likely DC today PM based on current progress.    [facility], Shannel Zahm 08/01/2016

## 2016-08-02 MED ORDER — ACETAMINOPHEN 325 MG PO TABS
ORAL_TABLET | ORAL | Status: AC
  Filled 2016-08-02: qty 2

## 2016-08-02 MED ORDER — BISACODYL 10 MG RE SUPP
10.0000 mg | Freq: Once | RECTAL | Status: AC
  Administered 2016-08-02: 10 mg via RECTAL
  Filled 2016-08-02: qty 1

## 2016-08-02 MED ORDER — POLYETHYLENE GLYCOL 3350 17 G PO PACK
17.0000 g | PACK | Freq: Every day | ORAL | Status: DC | PRN
  Administered 2016-08-02 – 2016-08-03 (×2): 17 g via ORAL
  Filled 2016-08-02 (×2): qty 1

## 2016-08-02 MED ORDER — ACETAMINOPHEN 325 MG PO TABS
650.0000 mg | ORAL_TABLET | ORAL | Status: DC | PRN
  Administered 2016-08-02: 650 mg via ORAL

## 2016-08-02 NOTE — Progress Notes (Signed)
2 Days Post-Op    Assessment and Plan: 1. S/P RALP for prostate cancer.   With overnight fever and persistent pain and lack of flatus, I will watch for another day.  Encouraged to walk.  Dulcolax suppository to stimulate the bowels.   Subjective: Patrick Edwards reports abdominal pain and no flatus.  He had a Tmax of 102.  His drain is out and the foley is draining clear urine.  ROS:  Review of Systems  Constitutional: Positive for fever.  Gastrointestinal: Positive for abdominal pain. Negative for nausea and vomiting.    Anti-infectives: Anti-infectives    Start     Dose/Rate Route Frequency Ordered Stop   07/31/16 0851  ceFAZolin (ANCEF) IVPB 2g/100 mL premix     2 g 200 mL/hr over 30 Minutes Intravenous 30 min pre-op 07/31/16 0851 07/31/16 1218   07/31/16 0000  sulfamethoxazole-trimethoprim (BACTRIM DS,SEPTRA DS) 800-160 MG tablet     1 tablet Oral 2 times daily 07/31/16 1059        Current Facility-Administered Medications  Medication Dose Route Frequency Provider Last Rate Last Dose  . acetaminophen (TYLENOL) tablet 650 mg  650 mg Oral Q4H PRN Alexis Frock, MD   650 mg at 08/02/16 0248  . alum & mag hydroxide-simeth (MAALOX/MYLANTA) 200-200-20 MG/5ML suspension 30 mL  30 mL Oral Q6H PRN Irine Seal, MD   30 mL at 08/01/16 2013  . diphenhydrAMINE (BENADRYL) injection 12.5-25 mg  12.5-25 mg Intravenous Q6H PRN Debbrah Alar, PA-C       Or  . diphenhydrAMINE (BENADRYL) 12.5 MG/5ML elixir 12.5-25 mg  12.5-25 mg Oral Q6H PRN Debbrah Alar, PA-C      . hydrochlorothiazide (HYDRODIURIL) tablet 25 mg  25 mg Oral Daily Debbrah Alar, PA-C   25 mg at 08/02/16 0827  . HYDROmorphone (DILAUDID) injection 0.5-1 mg  0.5-1 mg Intravenous Q2H PRN Debbrah Alar, PA-C   1 mg at 08/01/16 2134  . ondansetron (ZOFRAN) injection 4 mg  4 mg Intravenous Q4H PRN Debbrah Alar, PA-C   4 mg at 08/01/16 1940  . oxyCODONE (Oxy IR/ROXICODONE) immediate release tablet 5 mg  5 mg Oral Q4H PRN Debbrah Alar, PA-C   5  mg at 08/02/16 0827   Facility-Administered Medications Ordered in Other Encounters  Medication Dose Route Frequency Provider Last Rate Last Dose  . magnesium citrate solution 1 Bottle  1 Bottle Oral Once Alexis Frock, MD         Objective: Vital signs in last 24 hours: Temp:  [99.1 F (37.3 C)-102 F (38.9 C)] 99.6 F (37.6 C) (12/02 0424) Pulse Rate:  [57-65] 62 (12/02 0424) Resp:  [18] 18 (12/02 0424) BP: (121-146)/(72-82) 146/82 (12/02 0424) SpO2:  [94 %-100 %] 100 % (12/02 0424)  Intake/Output from previous day: 12/01 0701 - 12/02 0700 In: 450 [P.O.:450] Out: 800 [Urine:800] Intake/Output this shift: No intake/output data recorded.   Physical Exam  Constitutional: He is well-developed, well-nourished, and in no distress.  Cardiovascular: Normal rate, regular rhythm and normal heart sounds.   Pulmonary/Chest: Effort normal and breath sounds normal. No respiratory distress.  Abdominal: Soft. He exhibits no distension. There is tenderness (mild diffuse).  Minimal BS.  Wounds intact.   Genitourinary:  Genitourinary Comments: Foley draining well.  Musculoskeletal: Normal range of motion. He exhibits no edema or tenderness.    Lab Results:   Recent Labs  07/31/16 1606 08/01/16 0513  HGB 14.2 13.4  HCT 42.4 39.2   BMET  Recent Labs  08/01/16 0513  NA  136  K 4.0  CL 102  CO2 28  GLUCOSE 145*  BUN 12  CREATININE 1.33*  CALCIUM 8.1*   PT/INR No results for input(s): LABPROT, INR in the last 72 hours. ABG No results for input(s): PHART, HCO3 in the last 72 hours.  Invalid input(s): PCO2, PO2  Studies/Results: No results found.         LOS: 2 days    Patrick Edwards 08/02/2016 [id number] ID: Patrick Edwards, male   DOB: February 02, 1961, 55 y.o.   MRN: [id number]

## 2016-08-02 NOTE — Progress Notes (Signed)
Patient's temp this AM was 102 oral. Patient given tylenol and encouraged to use the IS more. We recheck temp within the next hr.

## 2016-08-03 NOTE — Discharge Summary (Signed)
Physician Discharge Summary  Patient ID: Patrick Edwards MRN: [id number] DOB/AGE: [birth year] 55 y.o.  Admit date: 07/31/2016 Discharge date: 08/03/2016  Admission Diagnoses:  Prostate cancer [facility])  Discharge Diagnoses:  Principal Problem:   Prostate cancer [facility])   Past Medical History:  Diagnosis Date  . Cancer [facility])    Prostate cancer, dx. with biopsy 06-02-16.  Marland Kitchen Heart murmur   . Hemorrhoids    no issue now  . Hypertension   . Increased urinary frequency    x 2-3 nightly  . Neuromuscular disorder ([place])    Bells Palsy '82-  left side facial weakness"slight"    Surgeries: Procedure(s): XI ROBOTIC ASSISTED LAPAROSCOPIC RADICAL PROSTATECTOMY WITH INDOCYANINE GREEN DYE INJECTION PELVIC LYMPHADENECTOMY on 07/31/2016   Consultants (if any):   Discharged Condition: Improved  Hospital Course: Patrick Edwards is an 55 y.o. male who was admitted 07/31/2016 with a diagnosis of Prostate cancer ([place]) and went to the operating room on 07/31/2016 and underwent the above named procedures.  [name] had a fever to 102 transiently the evening of surgery and was concerned about a lack of flatus on POD #1.  The  Drain was removed on POD#1.  He is afebrile today and has passed flatus.  He is otherwise without complaints and his exam is remarkable only for penile edema which he was reassured about.  He will be discharged home.   He was given perioperative antibiotics:  Anti-infectives    Start     Dose/Rate Route Frequency Ordered Stop   07/31/16 0851  ceFAZolin (ANCEF) IVPB 2g/100 mL premix     2 g 200 mL/hr over 30 Minutes Intravenous 30 min pre-op 07/31/16 0851 07/31/16 1218   07/31/16 0000  sulfamethoxazole-trimethoprim (BACTRIM DS,SEPTRA DS) 800-160 MG tablet     1 tablet Oral 2 times daily 07/31/16 1059      .  He was given sequential compression devices, and early ambulation for DVT prophylaxis.  He benefited maximally from the hospital stay and there were no complications.     Recent vital signs:  Vitals:   08/02/16 2135 08/03/16 0414  BP: (!) 143/88 (!) 148/91  Pulse: 67 66  Resp: 16 18  Temp: 100.2 F (37.9 C) 99.4 F (37.4 C)    Recent laboratory studies:  Lab Results  Component Value Date   HGB 13.4 08/01/2016   HGB 14.2 07/31/2016   HGB 14.5 07/29/2016   Lab Results  Component Value Date   WBC 7.3 07/29/2016   PLT 191 07/29/2016   No results found for: INR Lab Results  Component Value Date   NA 136 08/01/2016   K 4.0 08/01/2016   CL 102 08/01/2016   CO2 28 08/01/2016   BUN 12 08/01/2016   CREATININE 1.33 (H) 08/01/2016   GLUCOSE 145 (H) 08/01/2016    Discharge Medications:     Medication List    STOP taking these medications   multivitamin with minerals Tabs tablet   TURMERIC PO     TAKE these medications   hydrochlorothiazide 25 MG tablet Commonly known as:  HYDRODIURIL Take 25 mg by mouth daily.   HYDROcodone-acetaminophen 5-325 MG tablet Commonly known as:  NORCO Take 1-2 tablets by mouth every 6 (six) hours as needed for moderate pain or severe pain.   sulfamethoxazole-trimethoprim 800-160 MG tablet Commonly known as:  BACTRIM DS,SEPTRA DS Take 1 tablet by mouth 2 (two) times daily. Start the day prior to foley removal appointment       Diagnostic  Studies: No results found.  Disposition: Final discharge disposition not confirmed  Discharge Instructions    Discontinue IV    Complete by:  As directed       Follow-up Information    Alexis Frock, MD Follow up on 08/07/2016.   Specialty:  Urology Why:  at 11:45 for MD visit and catheter removal. Dr. Tresa Moore will call you with pathology results when available.  Contact information: [address] [place] [id number] [phone]            Signed: Malka So 08/03/2016, 8:57 AM

## 2016-08-03 NOTE — Progress Notes (Signed)
Reviewed discharge information with patient and caregiver. Answered all questions. Patient/caregiver able to teach back medications and reasons to contact MD/911.  Patient/caregiver demonstrate proper foley care and switching to leg bag. Patient verbalizes importance of PCP follow up appointment.  Patrick Edwards. Brigitte Pulse, RN

## 2016-08-04 NOTE — Op Note (Signed)
NAME:  Patrick Edwards, Patrick Edwards NO.:  [id number]  MEDICAL RECORD NO.:  [id number]  LOCATION:                                 FACILITY:  PHYSICIAN:  Alexis Frock, MD     DATE OF BIRTH:  10-Sep-1960  DATE OF PROCEDURE:                              OPERATIVE REPORT   DIAGNOSIS:  Large volume, moderate-risk prostate cancer.  PROCEDURE:  Robotic-assisted laparoscopic radical prostatectomy with bilateral pelvic lymphadenectomy with sentinel ICG injection.  ESTIMATED BLOOD LOSS:  150 mL.  COMPLICATIONS:  None.  ASSISTANT:  Debbrah Alar, PA.  DRAINS: 1. Jackson-Pratt drain to bulb suction. 2. Foley catheter to straight drain.  SPECIMENS: 1. Radical prostatectomy. 2. Revised bladder neck margin. 3. Anterior bladder neck margin frozen section. 4. Posterior bladder neck margin frozen section. 5. Right external iliac lymph node, sentinel. 6. Right obturator lymph nodes. 7. Left external iliac lymph node, sentinel. 8. Left obturator lymph nodes.  FINDINGS: 1. Very thickened bladder neck. 2. Sentinel lymph nodes noted in the bilateral external iliac lymph     node groups.  INDICATION:  Patrick Edwards is a very pleasant and quite vigorous 55 year old gentleman, who was found on workup of rising PSA to have multifocal adenocarcinoma of the prostate with 9/12 cores positive, some being moderate risk.  Options were discussed for management including surveillance protocols versus ablative therapies versus surgical extirpation with and without minimally invasive assistance, and he adamantly wished to proceed with robotic prostatectomy with pelvic lymphadenectomy.  Informed consent was obtained and placed in the medical record.  PROCEDURE IN DETAIL:  The patient being Patrick Edwards was verified.  Procedure being robotic prostatectomy was confirmed.  Procedure was performed. Intravenous antibiotics administered.  General anesthesia introduced. The patient was placed into a low  lithotomy position.  Sterile field was created by prepping and draping the patient's penis, perineum, proximal thighs using iodine and his infra-xiphoid abdomen using chlorhexidine gluconate.  After he was further fashioned to the operating table using 3-inch tape over foam padding across his supraxiphoid chest. A test of seep Trendelenburg positioning revealed adequate positioning.  Next, a high- flow, low-pressure pneumoperitoneum was obtained using Veress technique in the supraumbilical midline having passed the aspiration and drop test and after Foley catheter had been placed per urethra to straight drain. An 8-mm robotic camera port was placed in this location.  Laparoscopic examination of the peritoneal cavity revealed no significant adhesions and no visceral injury.  Additional ports were placed as follows; right paramedian 8-mm robotic port, right far lateral 12-mm assistant port, right paramedian 5-mm suction port, left paramedian 8-mm robotic port, left far lateral 8-mm robotic port.  Robot was docked and passed through the electronic checks.  Initial attention was directed at the development of space of Retzius.  Incision was made lateral to the right medial umbilical ligament from the midline towards the area of the internal ring coursing along the path of the iliac vessels towards the area of the right ureter and the right bladder wall was swept away from the pelvic sidewall towards the area of the endopelvic fascia on the right.  Vas deferens was encountered and purposely ligated and used a medial  bucket-handle for dissection.  Mirror image dissection was performed on the left side, anterior attachments were taken down using cautery and scissors.  The anterior base of the prostate was de-fatted, set aside, labeled as a periprostatic fat.  Next, 0.2 mL of indocyanine green dye was injected into each lobe of the prostate using a percutaneously placed robotically-guided needle  with intervening suctioning to prevent dye spillage, which did not occur.  Next, the endopelvic fascia was swept away from the lateral aspect of the prostate in a base-to-apex orientation.  This exposed the dorsal venous complex, which was controlled using vascular load stapler taking exquisite care to avoid membranous urethral injury, which did not occur and then approximately 10 minutes post dye injection and the pelvis was inspected under near-infrared fluorescence light.  Sentinel lymphangiography revealed several lymphatic channels coursing over the pelvic lymph node fields.  There was a single dominant sentinel lymph node within the right external iliac packet as well as left.  No additional sentinel lymph nodes were seen within the common iliac, internal, external, or obturator groups respectively.  Next, all fibrofatty tissue and confines of the right external iliac artery, vein, pelvic sidewall, and iliac bifurcation were carefully mobilized. Lymphostasis was achieved with cold clips, set aside labeled right external iliac lymph node, sentinel.  Similarly, all fibrofatty tissue and confines of the right external iliac vein, pelvic sidewall, and obturator nerve were carefully mobilized.  Lymphostasis was achieved with cold clips, labeled right obturator lymph nodes.  Similarly, left external iliac lymph nodes were resected and set aside and labeled as sentinel and left obturator group respectively.  Bilateral obturator nerves were inspected following these maneuvers and found to be uninjured.  Next, the bladder neck was identified in the anterior plane by moving the Foley catheter back and forth and dissection proceeded first with a lateral release in an anterior-posterior direction keeping what appeared to be a rim of circular muscle fibers at each side of the specimen.  Notably, the bladder neck was very thick.  Posterior dissection was performed by incising approximately 7 mm  inferior- posterior to the posterior lip of the prostate injuring the plane of Denonvilliers.  Bilateral vas deferens were dissected for a distance of approximately 3 cm, ligated and placed on gentle superior traction. Bilateral seminal vesicles were dissected to the tip and placed on gentle superior traction.  Dissection proceeded within this plane inferiorly toward the apex of the prostate.  This exposed the neurovascular pedicles bilaterally.  A sequential clipping purposely wide dissection was performed given large volume disease in a base-to- apex orientation.  Apical dissection was performed in the anterior plane by placing the prostate on gentle superior traction and then  transecting the membranous urethra keeping what appeared to be an adequate membranous urethral stump.  This completely freed up the prostatectomy specimen, was placed into an EndoCatch bag for later retrieval.  Given the very prominent thickness of the bladder neck especially anteriorly, this segment was revised, set aside as a separate specimen and more proximal to this, a separate anterior and posterior bladder neck margins were sent for frozen section, found to be grossly negative for carcinoma.  Next, rectal exam was performed using Indicator glove under laparoscopic vision, no mucosal abnormalities were seen. Posterior reconstruction was performed using a single 3-0 V-Loc suture reapproximating the posterior urethral plate to the posterior bladder neck bringing these structures into tension-free apposition.  Next, mucosa-to-mucosa anastomosis was performed using double-armed V-Loc suture from the 6 o'clock to 12  o'clock position which revealed excellent mucosal apposition of the bladder neck and membranous urethral stump.  This irrigated quantitatively after a new Foley catheter was placed per urethra to straight drain.  Following these maneuvers, all sponge and needle counts were correct.  Hemostasis  appeared excellent. A closed suction drain was brought out through the previous left lateral most robotic port site into the area of the peritoneal cavity.  Robot was then undocked.  The right far lateral system port was closed at the level of the fascia using Carter-Thomason suture passer and 0-Vicryl. Specimen was retrieved by extending the previous camera port site inferiorly for a total distance of approximately 3 cm removing the prostatectomy specimen, setting aside for permanent pathology.  This site was closed at the level of the fascia using figure-of-eight PDS x3 followed by Scarpa's with running Vicryl.  All incision sites were infiltrated with dilute lyophilized Marcaine and closed at the level of the skin using subcuticular Monocryl followed by Dermabond.  Procedure was terminated.  The patient tolerated the procedure well.  There were no immediate periprocedural complications.  The patient was taken to the postanesthesia care unit in a stable condition.  Please note, surgical assistant, Debbrah Alar was absolutely crucial for all laparoscopic and robotic portions of the procedure in which she provided invaluable retraction, intraoperative clipping, suctioning, irrigating, to provide adequate progression of the case today.    ______________________________ Alexis Frock, MD   ______________________________ Alexis Frock, MD    TM/MEDQ  D:  07/31/2016  T:  08/01/2016  Job:  [id number]

## 2016-08-07 DIAGNOSIS — C61 Malignant neoplasm of prostate: Secondary | ICD-10-CM | POA: Diagnosis not present

## 2016-08-21 DIAGNOSIS — N393 Stress incontinence (female) (male): Secondary | ICD-10-CM | POA: Diagnosis not present

## 2016-08-28 DIAGNOSIS — M6281 Muscle weakness (generalized): Secondary | ICD-10-CM | POA: Diagnosis not present

## 2016-08-28 DIAGNOSIS — M62838 Other muscle spasm: Secondary | ICD-10-CM | POA: Diagnosis not present

## 2016-08-28 DIAGNOSIS — N393 Stress incontinence (female) (male): Secondary | ICD-10-CM | POA: Diagnosis not present

## 2016-09-12 DIAGNOSIS — N393 Stress incontinence (female) (male): Secondary | ICD-10-CM | POA: Diagnosis not present

## 2016-09-12 DIAGNOSIS — M6281 Muscle weakness (generalized): Secondary | ICD-10-CM | POA: Diagnosis not present

## 2016-09-12 DIAGNOSIS — M62838 Other muscle spasm: Secondary | ICD-10-CM | POA: Diagnosis not present

## 2016-11-19 DIAGNOSIS — C61 Malignant neoplasm of prostate: Secondary | ICD-10-CM | POA: Diagnosis not present

## 2016-11-24 DIAGNOSIS — Z13220 Encounter for screening for lipoid disorders: Secondary | ICD-10-CM | POA: Diagnosis not present

## 2016-11-24 DIAGNOSIS — Z0000 Encounter for general adult medical examination without abnormal findings: Secondary | ICD-10-CM | POA: Diagnosis not present

## 2016-12-02 DIAGNOSIS — N5201 Erectile dysfunction due to arterial insufficiency: Secondary | ICD-10-CM | POA: Diagnosis not present

## 2016-12-02 DIAGNOSIS — N393 Stress incontinence (female) (male): Secondary | ICD-10-CM | POA: Diagnosis not present

## 2016-12-02 DIAGNOSIS — C61 Malignant neoplasm of prostate: Secondary | ICD-10-CM | POA: Diagnosis not present

## 2016-12-22 ENCOUNTER — Encounter: Payer: Self-pay | Admitting: *Deleted

## 2016-12-22 ENCOUNTER — Ambulatory Visit
Admission: EM | Admit: 2016-12-22 | Discharge: 2016-12-22 | Disposition: A | Discharge status: Home / Self Care | Payer: Federal, State, Local not specified - PPO | Attending: Family Medicine | Admitting: Family Medicine

## 2016-12-22 DIAGNOSIS — N4833 Priapism, drug-induced: Secondary | ICD-10-CM | POA: Diagnosis not present

## 2016-12-22 DIAGNOSIS — C61 Malignant neoplasm of prostate: Secondary | ICD-10-CM | POA: Diagnosis not present

## 2016-12-22 DIAGNOSIS — N5201 Erectile dysfunction due to arterial insufficiency: Secondary | ICD-10-CM | POA: Diagnosis not present

## 2016-12-22 NOTE — Discharge Instructions (Signed)
Contact your urologist tomorrow morning. If you have any further problems this evening go to the nearest emergency department

## 2016-12-22 NOTE — ED Provider Notes (Signed)
CSN: [id number]     Arrival date & time 12/22/16  1833 History   First MD Initiated Contact with Patient 12/22/16 1901     Chief Complaint  Patient presents with  . priapism   (Consider location/radiation/quality/duration/timing/severity/associated sxs/prior Treatment) HPI  This a 56 year old male who presents with priapism. Has a history of prostatic cancer that was excised robotically 11/17. Because of the inherent erectile dysfunction following the procedure he saw the urologist today who injected him with medication to see if that would help erectile dysfunction since pills were not beneficial. Injection was About 2:30 this afternoon. He has continued to have an erection that entire time and remembers being told to go to the emergency room if he needed. He felt that he just go to the urgent care instead and presented here. While here the erection abated and is back to normal again. No pain.        Past Medical History:  Diagnosis Date  . Cancer [facility])    Prostate cancer, dx. with biopsy 06-02-16.  Marland Kitchen Heart murmur   . Hemorrhoids    no issue now  . Hypertension   . Increased urinary frequency    x 2-3 nightly  . Neuromuscular disorder ([place])    Bells Palsy '82-  left side facial weakness"slight"   Past Surgical History:  Procedure Laterality Date  . LYMPHADENECTOMY Bilateral 07/31/2016   Procedure: PELVIC LYMPHADENECTOMY;  Surgeon: Alexis Frock, MD;  Location: [facility];  Service: Urology;  Laterality: Bilateral;  . ROBOT ASSISTED LAPAROSCOPIC RADICAL PROSTATECTOMY N/A 07/31/2016   Procedure: XI ROBOTIC ASSISTED LAPAROSCOPIC RADICAL PROSTATECTOMY WITH INDOCYANINE GREEN DYE INJECTION;  Surgeon: Alexis Frock, MD;  Location: [facility];  Service: Urology;  Laterality: N/A;  . TAYLOR BUNIONECTOMY Bilateral    '79  . TONSILLECTOMY     History reviewed. No pertinent family history. Social History  Substance Use Topics  . Smoking status: Never Smoker  . Smokeless tobacco: Never Used   . Alcohol use No    Review of Systems  Constitutional: Positive for activity change. Negative for chills, fatigue and fever.  Genitourinary: Positive for penile pain and penile swelling.  All other systems reviewed and are negative.   Allergies  Patient has no known allergies.  Home Medications   Prior to Admission medications   Medication Sig Start Date End Date Taking? Authorizing Provider  hydrochlorothiazide (HYDRODIURIL) 25 MG tablet Take 25 mg by mouth daily.    Yes Historical Provider, MD  HYDROcodone-acetaminophen (NORCO) 5-325 MG tablet Take 1-2 tablets by mouth every 6 (six) hours as needed for moderate pain or severe pain. 07/31/16   Debbrah Alar, PA-C  sulfamethoxazole-trimethoprim (BACTRIM DS,SEPTRA DS) 800-160 MG tablet Take 1 tablet by mouth 2 (two) times daily. Start the day prior to foley removal appointment 07/31/16   Debbrah Alar, PA-C   Meds Ordered and Administered this Visit  Medications - No data to display  Pulse (!) 46   Temp 99 F (37.2 C) (Oral)   Resp 16   Ht 5\' 9"  (1.753 m)   Wt 207 lb (93.9 kg)   SpO2 100%   BMI 30.57 kg/m  No data found.   Physical Exam  Constitutional: He is oriented to person, place, and time. He appears well-developed and well-nourished. No distress.  HENT:  Head: Normocephalic.  Eyes: Pupils are equal, round, and reactive to light.  Neck: Normal range of motion.  Genitourinary:  Genitourinary Comments: Deferred since the erection had subsided  Musculoskeletal: Normal range of  motion.  Neurological: He is alert and oriented to person, place, and time.  Skin: Skin is warm and dry. He is not diaphoretic.  Psychiatric: He has a normal mood and affect. His behavior is normal. Judgment and thought content normal.  Nursing note and vitals reviewed.   Urgent Care Course     Procedures (including critical care time)  Labs Review Labs Reviewed - No data to display  Imaging Review No results found.   Visual  Acuity Review  Right Eye Distance:   Left Eye Distance:   Bilateral Distance:    Right Eye Near:   Left Eye Near:    Bilateral Near:         MDM   1. Priapism, drug-induced    Nothing required during his visit since the erection had subsided on its own accord by the time he was seen. Recommended him to follow-up with his urologist tomorrow and to go to the emergency room immediately tonight if anything else would occur.    Lorin Picket, PA-C 12/22/16 1925

## 2016-12-22 NOTE — ED Triage Notes (Signed)
Treated today for erectile dysfunction and given med for erection at approx 1430. Has had a persistent erection since drug admin.

## 2017-03-13 DIAGNOSIS — K080 Exfoliation of teeth due to systemic causes: Secondary | ICD-10-CM | POA: Diagnosis not present

## 2017-04-27 DIAGNOSIS — Z713 Dietary counseling and surveillance: Secondary | ICD-10-CM | POA: Diagnosis not present

## 2017-06-01 DIAGNOSIS — C61 Malignant neoplasm of prostate: Secondary | ICD-10-CM | POA: Diagnosis not present

## 2017-06-08 DIAGNOSIS — N393 Stress incontinence (female) (male): Secondary | ICD-10-CM | POA: Diagnosis not present

## 2017-06-08 DIAGNOSIS — C61 Malignant neoplasm of prostate: Secondary | ICD-10-CM | POA: Diagnosis not present

## 2017-06-08 DIAGNOSIS — N5201 Erectile dysfunction due to arterial insufficiency: Secondary | ICD-10-CM | POA: Diagnosis not present

## 2017-11-25 DIAGNOSIS — E7800 Pure hypercholesterolemia, unspecified: Secondary | ICD-10-CM | POA: Diagnosis not present

## 2017-11-25 DIAGNOSIS — Z0000 Encounter for general adult medical examination without abnormal findings: Secondary | ICD-10-CM | POA: Diagnosis not present

## 2017-11-30 DIAGNOSIS — C61 Malignant neoplasm of prostate: Secondary | ICD-10-CM | POA: Diagnosis not present

## 2017-12-07 DIAGNOSIS — N393 Stress incontinence (female) (male): Secondary | ICD-10-CM | POA: Diagnosis not present

## 2017-12-07 DIAGNOSIS — C61 Malignant neoplasm of prostate: Secondary | ICD-10-CM | POA: Diagnosis not present

## 2017-12-07 DIAGNOSIS — N5201 Erectile dysfunction due to arterial insufficiency: Secondary | ICD-10-CM | POA: Diagnosis not present

## 2018-06-01 DIAGNOSIS — C61 Malignant neoplasm of prostate: Secondary | ICD-10-CM | POA: Diagnosis not present

## 2018-06-08 DIAGNOSIS — N5201 Erectile dysfunction due to arterial insufficiency: Secondary | ICD-10-CM | POA: Diagnosis not present

## 2018-06-08 DIAGNOSIS — N393 Stress incontinence (female) (male): Secondary | ICD-10-CM | POA: Diagnosis not present

## 2018-06-08 DIAGNOSIS — C61 Malignant neoplasm of prostate: Secondary | ICD-10-CM | POA: Diagnosis not present

## 2018-10-19 DIAGNOSIS — Z83511 Family history of glaucoma: Secondary | ICD-10-CM | POA: Diagnosis not present

## 2018-10-19 DIAGNOSIS — H2513 Age-related nuclear cataract, bilateral: Secondary | ICD-10-CM | POA: Diagnosis not present

## 2018-10-19 DIAGNOSIS — H02402 Unspecified ptosis of left eyelid: Secondary | ICD-10-CM | POA: Diagnosis not present

## 2018-11-28 DIAGNOSIS — Z8546 Personal history of malignant neoplasm of prostate: Secondary | ICD-10-CM | POA: Diagnosis not present

## 2018-11-28 DIAGNOSIS — I10 Essential (primary) hypertension: Secondary | ICD-10-CM | POA: Diagnosis not present

## 2018-11-28 DIAGNOSIS — E785 Hyperlipidemia, unspecified: Secondary | ICD-10-CM | POA: Diagnosis not present

## 2018-11-28 DIAGNOSIS — N4833 Priapism, drug-induced: Secondary | ICD-10-CM | POA: Diagnosis not present

## 2018-11-28 DIAGNOSIS — N489 Disorder of penis, unspecified: Secondary | ICD-10-CM | POA: Diagnosis not present

## 2018-11-28 DIAGNOSIS — N529 Male erectile dysfunction, unspecified: Secondary | ICD-10-CM | POA: Diagnosis not present

## 2018-11-28 DIAGNOSIS — Z79899 Other long term (current) drug therapy: Secondary | ICD-10-CM | POA: Diagnosis not present

## 2018-11-29 DIAGNOSIS — N4839 Other priapism: Secondary | ICD-10-CM | POA: Diagnosis not present

## 2018-11-30 DIAGNOSIS — I10 Essential (primary) hypertension: Secondary | ICD-10-CM | POA: Diagnosis not present

## 2018-11-30 DIAGNOSIS — E7800 Pure hypercholesterolemia, unspecified: Secondary | ICD-10-CM | POA: Diagnosis not present

## 2018-11-30 DIAGNOSIS — N529 Male erectile dysfunction, unspecified: Secondary | ICD-10-CM | POA: Diagnosis not present

## 2018-11-30 DIAGNOSIS — Z0000 Encounter for general adult medical examination without abnormal findings: Secondary | ICD-10-CM | POA: Diagnosis not present

## 2018-12-22 DIAGNOSIS — R51 Headache: Secondary | ICD-10-CM | POA: Diagnosis not present

## 2018-12-22 DIAGNOSIS — M791 Myalgia, unspecified site: Secondary | ICD-10-CM | POA: Diagnosis not present

## 2018-12-22 DIAGNOSIS — I10 Essential (primary) hypertension: Secondary | ICD-10-CM | POA: Diagnosis not present

## 2018-12-22 DIAGNOSIS — Z20828 Contact with and (suspected) exposure to other viral communicable diseases: Secondary | ICD-10-CM | POA: Diagnosis not present

## 2018-12-22 DIAGNOSIS — R509 Fever, unspecified: Secondary | ICD-10-CM | POA: Diagnosis not present

## 2018-12-23 ENCOUNTER — Ambulatory Visit
Admission: RE | Admit: 2018-12-23 | Discharge: 2018-12-23 | Disposition: A | Discharge status: Home / Self Care | Payer: Federal, State, Local not specified - PPO | Source: Ambulatory Visit | Attending: Family Medicine | Admitting: Family Medicine

## 2018-12-23 ENCOUNTER — Other Ambulatory Visit (HOSPITAL_COMMUNITY)
Admission: RE | Admit: 2018-12-23 | Discharge: 2018-12-23 | Disposition: A | Discharge status: Home / Self Care | Payer: Federal, State, Local not specified - PPO | Attending: Family Medicine | Admitting: Family Medicine

## 2018-12-23 ENCOUNTER — Other Ambulatory Visit: Payer: Self-pay | Admitting: Family Medicine

## 2018-12-23 DIAGNOSIS — R509 Fever, unspecified: Secondary | ICD-10-CM | POA: Insufficient documentation

## 2018-12-23 DIAGNOSIS — J189 Pneumonia, unspecified organism: Secondary | ICD-10-CM | POA: Diagnosis not present

## 2018-12-23 LAB — SEDIMENTATION RATE: Sed Rate: 44 mm/hr — ABNORMAL HIGH (ref 0–16)

## 2018-12-23 LAB — CBC WITH DIFFERENTIAL/PLATELET
Abs Immature Granulocytes: 0.04 10*3/uL (ref 0.00–0.07)
Basophils Absolute: 0 10*3/uL (ref 0.0–0.1)
Basophils Relative: 0 %
Eosinophils Absolute: 0 10*3/uL (ref 0.0–0.5)
Eosinophils Relative: 0 %
HCT: 39.6 % (ref 39.0–52.0)
Hemoglobin: 13.5 g/dL (ref 13.0–17.0)
Immature Granulocytes: 1 %
Lymphocytes Relative: 12 %
Lymphs Abs: 1 10*3/uL (ref 0.7–4.0)
MCH: 29.6 pg (ref 26.0–34.0)
MCHC: 34.1 g/dL (ref 30.0–36.0)
MCV: 86.8 fL (ref 80.0–100.0)
Monocytes Absolute: 0.9 10*3/uL (ref 0.1–1.0)
Monocytes Relative: 11 %
Neutro Abs: 6.5 10*3/uL (ref 1.7–7.7)
Neutrophils Relative %: 76 %
Platelets: 111 10*3/uL — ABNORMAL LOW (ref 150–400)
RBC: 4.56 MIL/uL (ref 4.22–5.81)
RDW: 12.8 % (ref 11.5–15.5)
WBC: 8.5 10*3/uL (ref 4.0–10.5)
nRBC: 0 % (ref 0.0–0.2)

## 2018-12-23 LAB — COMPREHENSIVE METABOLIC PANEL
ALT: 40 U/L (ref 0–44)
AST: 59 U/L — ABNORMAL HIGH (ref 15–41)
Albumin: 3.3 g/dL — ABNORMAL LOW (ref 3.5–5.0)
Alkaline Phosphatase: 46 U/L (ref 38–126)
Anion gap: 12 (ref 5–15)
BUN: 11 mg/dL (ref 6–20)
CO2: 29 mmol/L (ref 22–32)
Calcium: 8.5 mg/dL — ABNORMAL LOW (ref 8.9–10.3)
Chloride: 93 mmol/L — ABNORMAL LOW (ref 98–111)
Creatinine, Ser: 1.64 mg/dL — ABNORMAL HIGH (ref 0.61–1.24)
GFR calc Af Amer: 53 mL/min — ABNORMAL LOW (ref >60)
GFR calc non Af Amer: 46 mL/min — ABNORMAL LOW (ref >60)
Glucose, Bld: 137 mg/dL — ABNORMAL HIGH (ref 70–99)
Potassium: 3.2 mmol/L — ABNORMAL LOW (ref 3.5–5.1)
Sodium: 134 mmol/L — ABNORMAL LOW (ref 135–145)
Total Bilirubin: 1.6 mg/dL — ABNORMAL HIGH (ref 0.3–1.2)
Total Protein: 6.7 g/dL (ref 6.5–8.1)

## 2018-12-28 LAB — CULTURE, BLOOD (ROUTINE X 2)
Culture: NO GROWTH
Culture: NO GROWTH
Special Requests: ADEQUATE

## 2019-01-04 DIAGNOSIS — R944 Abnormal results of kidney function studies: Secondary | ICD-10-CM | POA: Diagnosis not present

## 2019-01-20 DIAGNOSIS — R740 Nonspecific elevation of levels of transaminase and lactic acid dehydrogenase [LDH]: Secondary | ICD-10-CM | POA: Diagnosis not present

## 2019-06-07 DIAGNOSIS — C61 Malignant neoplasm of prostate: Secondary | ICD-10-CM | POA: Diagnosis not present

## 2019-06-14 DIAGNOSIS — N393 Stress incontinence (female) (male): Secondary | ICD-10-CM | POA: Diagnosis not present

## 2019-06-14 DIAGNOSIS — C61 Malignant neoplasm of prostate: Secondary | ICD-10-CM | POA: Diagnosis not present

## 2019-06-14 DIAGNOSIS — N5201 Erectile dysfunction due to arterial insufficiency: Secondary | ICD-10-CM | POA: Diagnosis not present

## 2019-07-31 DIAGNOSIS — M545 Low back pain: Secondary | ICD-10-CM | POA: Diagnosis not present

## 2019-07-31 DIAGNOSIS — M542 Cervicalgia: Secondary | ICD-10-CM | POA: Diagnosis not present

## 2019-07-31 DIAGNOSIS — I10 Essential (primary) hypertension: Secondary | ICD-10-CM | POA: Diagnosis not present

## 2019-07-31 DIAGNOSIS — E785 Hyperlipidemia, unspecified: Secondary | ICD-10-CM | POA: Diagnosis not present

## 2019-07-31 DIAGNOSIS — Z8546 Personal history of malignant neoplasm of prostate: Secondary | ICD-10-CM | POA: Diagnosis not present

## 2019-07-31 DIAGNOSIS — M5137 Other intervertebral disc degeneration, lumbosacral region: Secondary | ICD-10-CM | POA: Diagnosis not present

## 2019-07-31 DIAGNOSIS — Z23 Encounter for immunization: Secondary | ICD-10-CM | POA: Diagnosis not present

## 2019-07-31 DIAGNOSIS — Z79899 Other long term (current) drug therapy: Secondary | ICD-10-CM | POA: Diagnosis not present

## 2019-11-10 ENCOUNTER — Ambulatory Visit: Payer: Self-pay | Attending: Internal Medicine

## 2019-11-10 DIAGNOSIS — Z23 Encounter for immunization: Secondary | ICD-10-CM

## 2019-11-10 NOTE — Progress Notes (Signed)
   Covid-19 Vaccination Clinic  Name:  Patrick Edwards    MRN: [id number] DOB: 06/19/1961  11/10/2019  Patrick Edwards was observed post Covid-19 immunization for 15 minutes without incident. He was provided with Vaccine Information Sheet and instruction to access the V-Safe system.   Patrick Edwards was instructed to call 911 with any severe reactions post vaccine: Marland Kitchen Difficulty breathing  . Swelling of face and throat  . A fast heartbeat  . A bad rash all over body  . Dizziness and weakness   Immunizations Administered    Name Date Dose VIS Date Route   Pfizer COVID-19 Vaccine 11/10/2019  4:42 PM 0.3 mL 08/12/2019 Intramuscular   Manufacturer: [place]   Lot: [id number]   [place]: [id number]

## 2019-12-05 ENCOUNTER — Ambulatory Visit: Payer: Self-pay | Attending: Internal Medicine

## 2019-12-05 DIAGNOSIS — Z23 Encounter for immunization: Secondary | ICD-10-CM

## 2019-12-05 NOTE — Progress Notes (Signed)
   Covid-19 Vaccination Clinic  Name:  Patrick Edwards    MRN: [id number] DOB: [birth year]  12/05/2019  Mr. Patrick Edwards was observed post Covid-19 immunization for 15 minutes without incident. He was provided with Vaccine Information Sheet and instruction to access the V-Safe system.   Mr. Patrick Edwards was instructed to call 911 with any severe reactions post vaccine: Marland Kitchen Difficulty breathing  . Swelling of face and throat  . A fast heartbeat  . A bad rash all over body  . Dizziness and weakness   Immunizations Administered    Name Date Dose VIS Date Route   Pfizer COVID-19 Vaccine 12/05/2019 11:37 AM 0.3 mL 08/12/2019 Intramuscular   Manufacturer: [place]   Lot: [id number]   [place]: [id number]

## 2019-12-12 DIAGNOSIS — E7800 Pure hypercholesterolemia, unspecified: Secondary | ICD-10-CM | POA: Diagnosis not present

## 2019-12-12 DIAGNOSIS — I10 Essential (primary) hypertension: Secondary | ICD-10-CM | POA: Diagnosis not present

## 2019-12-12 DIAGNOSIS — R7401 Elevation of levels of liver transaminase levels: Secondary | ICD-10-CM | POA: Diagnosis not present

## 2019-12-12 DIAGNOSIS — Z0000 Encounter for general adult medical examination without abnormal findings: Secondary | ICD-10-CM | POA: Diagnosis not present

## 2019-12-12 DIAGNOSIS — N529 Male erectile dysfunction, unspecified: Secondary | ICD-10-CM | POA: Diagnosis not present

## 2019-12-22 ENCOUNTER — Other Ambulatory Visit: Payer: Self-pay | Admitting: Family Medicine

## 2019-12-22 DIAGNOSIS — R7401 Elevation of levels of liver transaminase levels: Secondary | ICD-10-CM

## 2019-12-26 ENCOUNTER — Ambulatory Visit
Admission: RE | Admit: 2019-12-26 | Discharge: 2019-12-26 | Disposition: A | Discharge status: Home / Self Care | Payer: Federal, State, Local not specified - PPO | Source: Ambulatory Visit | Attending: Family Medicine | Admitting: Family Medicine

## 2019-12-26 DIAGNOSIS — R7401 Elevation of levels of liver transaminase levels: Secondary | ICD-10-CM

## 2019-12-26 DIAGNOSIS — R748 Abnormal levels of other serum enzymes: Secondary | ICD-10-CM | POA: Diagnosis not present

## 2019-12-27 DIAGNOSIS — Z23 Encounter for immunization: Secondary | ICD-10-CM | POA: Diagnosis not present

## 2019-12-27 DIAGNOSIS — I10 Essential (primary) hypertension: Secondary | ICD-10-CM | POA: Diagnosis not present

## 2020-02-27 DIAGNOSIS — Z23 Encounter for immunization: Secondary | ICD-10-CM | POA: Diagnosis not present

## 2020-03-06 DIAGNOSIS — Z79899 Other long term (current) drug therapy: Secondary | ICD-10-CM | POA: Diagnosis not present

## 2020-06-12 DIAGNOSIS — C61 Malignant neoplasm of prostate: Secondary | ICD-10-CM | POA: Diagnosis not present

## 2020-06-12 DIAGNOSIS — N401 Enlarged prostate with lower urinary tract symptoms: Secondary | ICD-10-CM | POA: Diagnosis not present

## 2020-06-12 DIAGNOSIS — N3941 Urge incontinence: Secondary | ICD-10-CM | POA: Diagnosis not present

## 2020-06-19 DIAGNOSIS — N393 Stress incontinence (female) (male): Secondary | ICD-10-CM | POA: Diagnosis not present

## 2020-06-19 DIAGNOSIS — C61 Malignant neoplasm of prostate: Secondary | ICD-10-CM | POA: Diagnosis not present

## 2020-06-19 DIAGNOSIS — N5201 Erectile dysfunction due to arterial insufficiency: Secondary | ICD-10-CM | POA: Diagnosis not present

## 2020-10-23 DIAGNOSIS — M25561 Pain in right knee: Secondary | ICD-10-CM | POA: Diagnosis not present

## 2020-10-23 DIAGNOSIS — M25562 Pain in left knee: Secondary | ICD-10-CM | POA: Diagnosis not present

## 2020-11-14 DIAGNOSIS — M25561 Pain in right knee: Secondary | ICD-10-CM | POA: Diagnosis not present

## 2020-11-14 DIAGNOSIS — M25562 Pain in left knee: Secondary | ICD-10-CM | POA: Diagnosis not present

## 2020-11-16 DIAGNOSIS — M25562 Pain in left knee: Secondary | ICD-10-CM | POA: Diagnosis not present

## 2020-11-16 DIAGNOSIS — M25561 Pain in right knee: Secondary | ICD-10-CM | POA: Diagnosis not present

## 2020-11-20 DIAGNOSIS — M25561 Pain in right knee: Secondary | ICD-10-CM | POA: Diagnosis not present

## 2020-11-20 DIAGNOSIS — M25562 Pain in left knee: Secondary | ICD-10-CM | POA: Diagnosis not present

## 2020-11-22 DIAGNOSIS — M25562 Pain in left knee: Secondary | ICD-10-CM | POA: Diagnosis not present

## 2020-11-22 DIAGNOSIS — M25561 Pain in right knee: Secondary | ICD-10-CM | POA: Diagnosis not present

## 2020-11-27 DIAGNOSIS — M25562 Pain in left knee: Secondary | ICD-10-CM | POA: Diagnosis not present

## 2020-11-27 DIAGNOSIS — M25561 Pain in right knee: Secondary | ICD-10-CM | POA: Diagnosis not present

## 2020-11-29 DIAGNOSIS — M25561 Pain in right knee: Secondary | ICD-10-CM | POA: Diagnosis not present

## 2020-11-29 DIAGNOSIS — M25562 Pain in left knee: Secondary | ICD-10-CM | POA: Diagnosis not present

## 2020-12-04 DIAGNOSIS — M25562 Pain in left knee: Secondary | ICD-10-CM | POA: Diagnosis not present

## 2020-12-04 DIAGNOSIS — M25561 Pain in right knee: Secondary | ICD-10-CM | POA: Diagnosis not present

## 2020-12-06 DIAGNOSIS — M25561 Pain in right knee: Secondary | ICD-10-CM | POA: Diagnosis not present

## 2020-12-06 DIAGNOSIS — M25562 Pain in left knee: Secondary | ICD-10-CM | POA: Diagnosis not present

## 2020-12-10 DIAGNOSIS — M25561 Pain in right knee: Secondary | ICD-10-CM | POA: Diagnosis not present

## 2020-12-10 DIAGNOSIS — M25562 Pain in left knee: Secondary | ICD-10-CM | POA: Diagnosis not present

## 2020-12-12 DIAGNOSIS — M25561 Pain in right knee: Secondary | ICD-10-CM | POA: Diagnosis not present

## 2020-12-12 DIAGNOSIS — Z23 Encounter for immunization: Secondary | ICD-10-CM | POA: Diagnosis not present

## 2020-12-12 DIAGNOSIS — Z0000 Encounter for general adult medical examination without abnormal findings: Secondary | ICD-10-CM | POA: Diagnosis not present

## 2020-12-12 DIAGNOSIS — E7800 Pure hypercholesterolemia, unspecified: Secondary | ICD-10-CM | POA: Diagnosis not present

## 2020-12-12 DIAGNOSIS — I10 Essential (primary) hypertension: Secondary | ICD-10-CM | POA: Diagnosis not present

## 2020-12-12 DIAGNOSIS — M25562 Pain in left knee: Secondary | ICD-10-CM | POA: Diagnosis not present

## 2020-12-31 DIAGNOSIS — M25562 Pain in left knee: Secondary | ICD-10-CM | POA: Diagnosis not present

## 2021-01-04 DIAGNOSIS — Z20822 Contact with and (suspected) exposure to covid-19: Secondary | ICD-10-CM | POA: Diagnosis not present

## 2021-01-23 DIAGNOSIS — M25562 Pain in left knee: Secondary | ICD-10-CM | POA: Diagnosis not present

## 2021-02-06 DIAGNOSIS — S83232D Complex tear of medial meniscus, current injury, left knee, subsequent encounter: Secondary | ICD-10-CM | POA: Diagnosis not present

## 2021-02-11 DIAGNOSIS — S83232D Complex tear of medial meniscus, current injury, left knee, subsequent encounter: Secondary | ICD-10-CM | POA: Diagnosis not present

## 2021-03-16 IMAGING — CR CHEST - 2 VIEW
2 series · 2 of 2 positions shown · non-contrast
Comparison: None.

CLINICAL DATA: Fever for the past 4 days.  No chest complaints.

EXAM:
CHEST - 2 VIEW

[w chest pa]
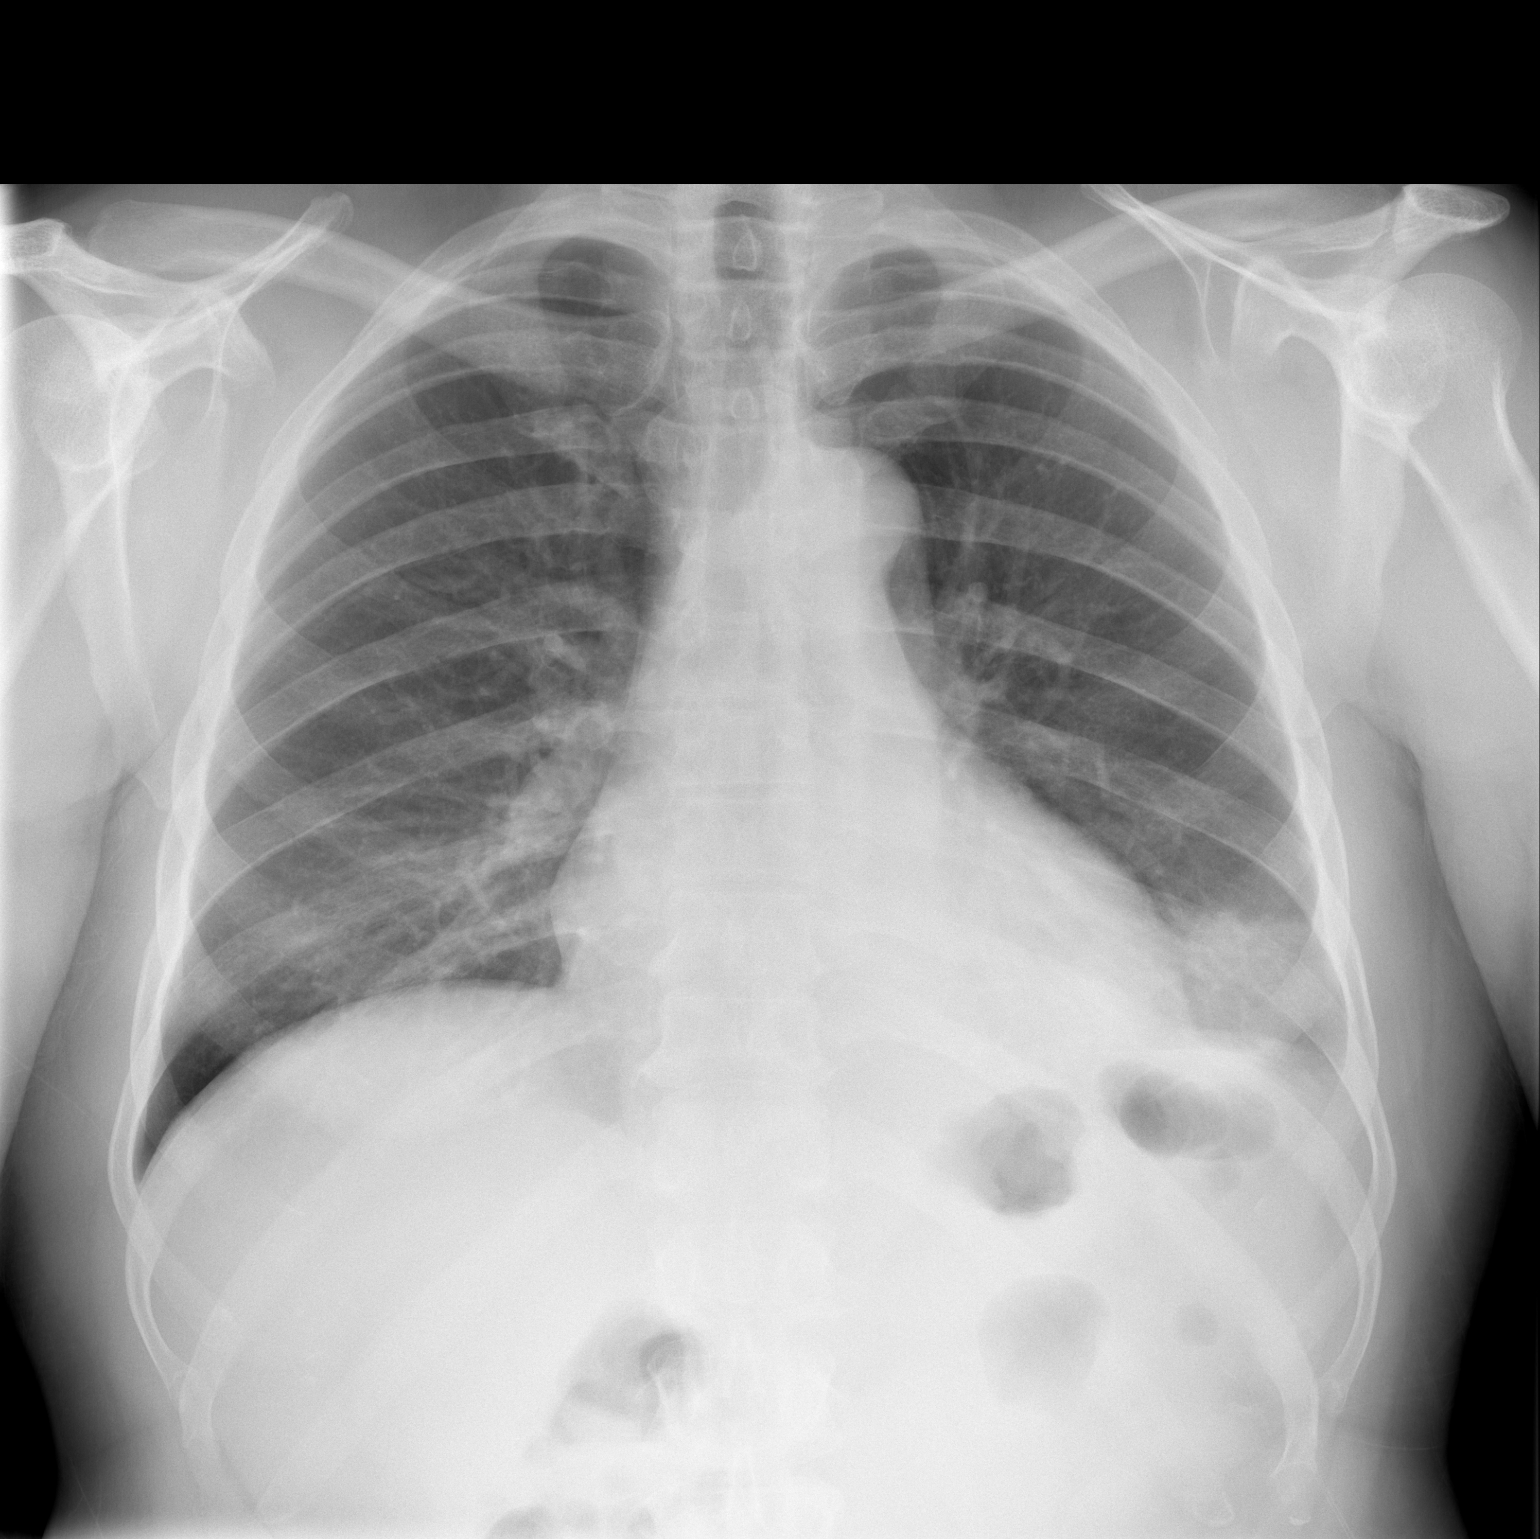

[w chest lat]
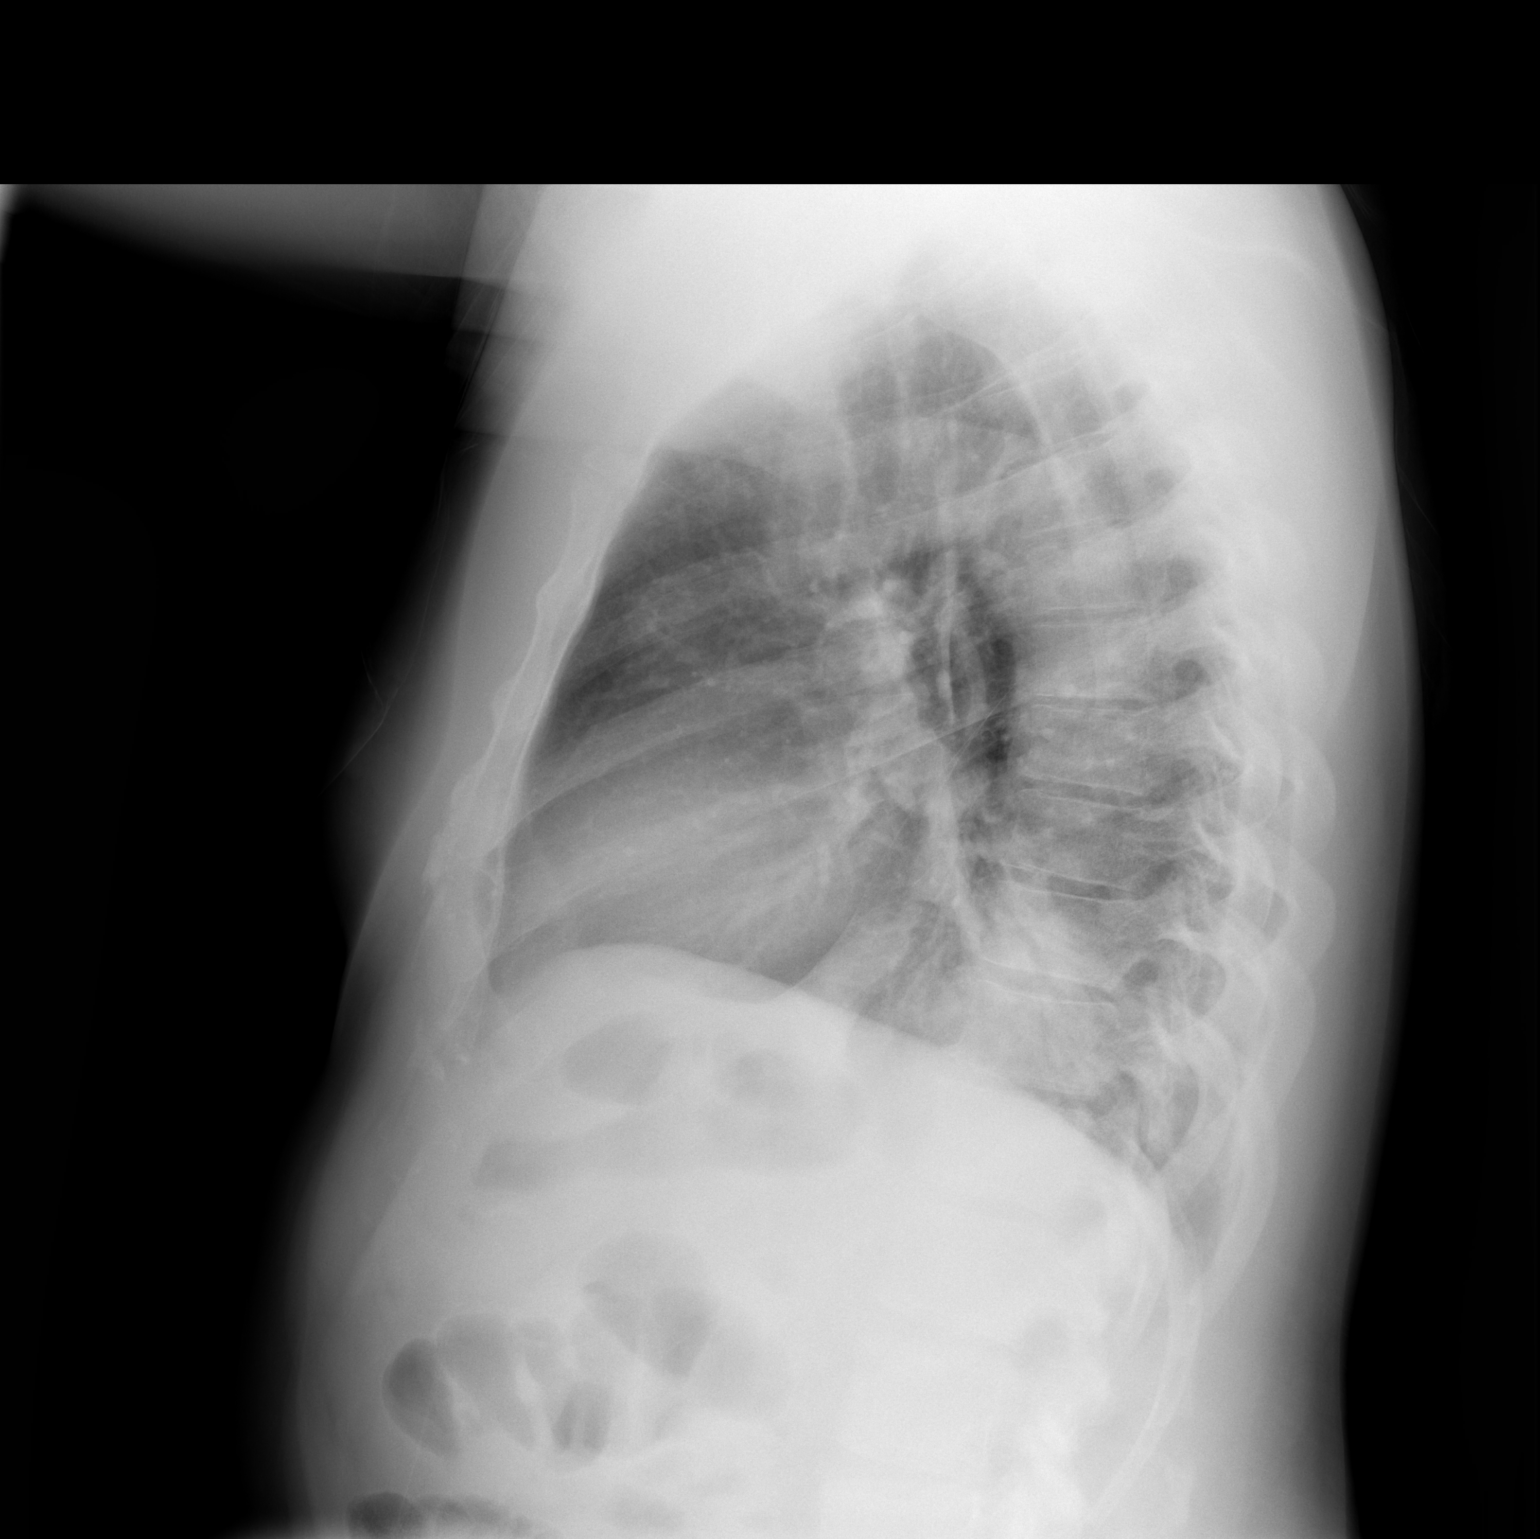

[2 of 2 positions shown; findings below may reference images not displayed]

FINDINGS: The heart size and mediastinal contours are within normal limits.
Normal pulmonary vascularity. Consolidation in the left lower lobe.
No pleural effusion or pneumothorax. No acute osseous abnormality.
IMPRESSION: Left lower lobe pneumonia.

These results will be called to the ordering clinician or
representative by the [HOSPITAL] at the imaging location.

## 2021-04-16 ENCOUNTER — Other Ambulatory Visit: Payer: Self-pay

## 2021-04-16 ENCOUNTER — Encounter (HOSPITAL_BASED_OUTPATIENT_CLINIC_OR_DEPARTMENT_OTHER): Payer: Self-pay | Admitting: Orthopedic Surgery

## 2021-04-16 NOTE — Progress Notes (Signed)
Spoke w/ via phone for pre-op interview--- pt Lab needs dos----  [employer] and ekg            Lab results------ no COVID test -----patient states asymptomatic no test needed Arrive at ------- 1045 on 04-19-2021 NPO after MN NO Solid Food.  Clear liquids from MN until---0945 Med rec completed Medications to take morning of surgery ----- lipitor Diabetic medication ----- n/a Patient instructed no nail polish to be worn day of surgery Patient instructed to bring photo id and insurance card day of surgery Patient aware to have Driver (ride ) / caregiver for 24 hours after surgery -- wife, crystal Patient Special Instructions ----- n/a Pre-Op special Istructions ----- n/a Patient verbalized understanding of instructions that were given at this phone interview. Patient denies shortness of breath, chest pain, fever, cough at this phone interview.

## 2021-04-19 ENCOUNTER — Ambulatory Visit (HOSPITAL_COMMUNITY): Payer: Federal, State, Local not specified - PPO

## 2021-04-19 ENCOUNTER — Ambulatory Visit (HOSPITAL_BASED_OUTPATIENT_CLINIC_OR_DEPARTMENT_OTHER): Payer: Federal, State, Local not specified - PPO | Admitting: Anesthesiology

## 2021-04-19 ENCOUNTER — Encounter (HOSPITAL_BASED_OUTPATIENT_CLINIC_OR_DEPARTMENT_OTHER)
Admission: RE | Disposition: A | Discharge status: Home / Self Care | Payer: Self-pay | Source: Home / Self Care | Attending: Orthopedic Surgery

## 2021-04-19 ENCOUNTER — Encounter (HOSPITAL_BASED_OUTPATIENT_CLINIC_OR_DEPARTMENT_OTHER): Payer: Self-pay | Admitting: Orthopedic Surgery

## 2021-04-19 ENCOUNTER — Ambulatory Visit (HOSPITAL_BASED_OUTPATIENT_CLINIC_OR_DEPARTMENT_OTHER)
Admission: RE | Admit: 2021-04-19 | Discharge: 2021-04-19 | Disposition: A | Discharge status: Home / Self Care | Payer: Federal, State, Local not specified - PPO | Attending: Orthopedic Surgery | Admitting: Orthopedic Surgery

## 2021-04-19 DIAGNOSIS — M2242 Chondromalacia patellae, left knee: Secondary | ICD-10-CM | POA: Diagnosis not present

## 2021-04-19 DIAGNOSIS — S72432A Displaced fracture of medial condyle of left femur, initial encounter for closed fracture: Secondary | ICD-10-CM | POA: Diagnosis not present

## 2021-04-19 DIAGNOSIS — S83282A Other tear of lateral meniscus, current injury, left knee, initial encounter: Secondary | ICD-10-CM | POA: Diagnosis not present

## 2021-04-19 DIAGNOSIS — S83232A Complex tear of medial meniscus, current injury, left knee, initial encounter: Secondary | ICD-10-CM | POA: Diagnosis not present

## 2021-04-19 DIAGNOSIS — M94262 Chondromalacia, left knee: Secondary | ICD-10-CM | POA: Insufficient documentation

## 2021-04-19 DIAGNOSIS — S83242A Other tear of medial meniscus, current injury, left knee, initial encounter: Secondary | ICD-10-CM | POA: Insufficient documentation

## 2021-04-19 DIAGNOSIS — S83241A Other tear of medial meniscus, current injury, right knee, initial encounter: Secondary | ICD-10-CM | POA: Diagnosis not present

## 2021-04-19 DIAGNOSIS — M84352A Stress fracture, left femur, initial encounter for fracture: Secondary | ICD-10-CM | POA: Diagnosis not present

## 2021-04-19 DIAGNOSIS — Z79899 Other long term (current) drug therapy: Secondary | ICD-10-CM | POA: Diagnosis not present

## 2021-04-19 DIAGNOSIS — X58XXXA Exposure to other specified factors, initial encounter: Secondary | ICD-10-CM | POA: Insufficient documentation

## 2021-04-19 DIAGNOSIS — M25569 Pain in unspecified knee: Secondary | ICD-10-CM

## 2021-04-19 DIAGNOSIS — G8918 Other acute postprocedural pain: Secondary | ICD-10-CM | POA: Diagnosis not present

## 2021-04-19 DIAGNOSIS — Z8546 Personal history of malignant neoplasm of prostate: Secondary | ICD-10-CM | POA: Diagnosis not present

## 2021-04-19 DIAGNOSIS — M25562 Pain in left knee: Secondary | ICD-10-CM

## 2021-04-19 DIAGNOSIS — M65862 Other synovitis and tenosynovitis, left lower leg: Secondary | ICD-10-CM | POA: Diagnosis not present

## 2021-04-19 HISTORY — PX: KNEE ARTHROSCOPY WITH SUBCHONDROPLASTY: SHX6732

## 2021-04-19 HISTORY — DX: Unspecified tear of unspecified meniscus, current injury, left knee, initial encounter: S83.207A

## 2021-04-19 HISTORY — DX: Nocturia: R35.1

## 2021-04-19 HISTORY — DX: Male erectile dysfunction, unspecified: N52.9

## 2021-04-19 LAB — POCT I-STAT, CHEM 8
BUN: 20 mg/dL (ref 6–20)
Calcium, Ion: 1.24 mmol/L (ref 1.15–1.40)
Chloride: 101 mmol/L (ref 98–111)
Creatinine, Ser: 1.4 mg/dL — ABNORMAL HIGH (ref 0.61–1.24)
Glucose, Bld: 90 mg/dL (ref 70–99)
HCT: 45 % (ref 39.0–52.0)
Hemoglobin: 15.3 g/dL (ref 13.0–17.0)
Potassium: 4.9 mmol/L (ref 3.5–5.1)
Sodium: 141 mmol/L (ref 135–145)
TCO2: 33 mmol/L — ABNORMAL HIGH (ref 22–32)

## 2021-04-19 SURGERY — ARTHROSCOPY, KNEE, WITH SUBCHONDROPLASTY
Anesthesia: General | Site: Knee | Laterality: Left

## 2021-04-19 MED ORDER — OXYCODONE HCL 5 MG PO TABS
ORAL_TABLET | ORAL | Status: AC
  Filled 2021-04-19: qty 1

## 2021-04-19 MED ORDER — MIDAZOLAM HCL 5 MG/5ML IJ SOLN
INTRAMUSCULAR | Status: DC | PRN
Start: 2021-04-19 — End: 2021-04-19
  Administered 2021-04-19: 2 mg via INTRAVENOUS

## 2021-04-19 MED ORDER — MIDAZOLAM HCL 2 MG/2ML IJ SOLN
2.0000 mg | Freq: Once | INTRAMUSCULAR | Status: AC
  Administered 2021-04-19: 2 mg via INTRAVENOUS

## 2021-04-19 MED ORDER — DEXAMETHASONE SODIUM PHOSPHATE 10 MG/ML IJ SOLN
INTRAMUSCULAR | Status: AC
  Filled 2021-04-19: qty 1

## 2021-04-19 MED ORDER — ONDANSETRON HCL 4 MG/2ML IJ SOLN
INTRAMUSCULAR | Status: AC
  Filled 2021-04-19: qty 2

## 2021-04-19 MED ORDER — MIDAZOLAM HCL 2 MG/2ML IJ SOLN
INTRAMUSCULAR | Status: AC
  Filled 2021-04-19: qty 2

## 2021-04-19 MED ORDER — PROPOFOL 10 MG/ML IV BOLUS
INTRAVENOUS | Status: AC
  Filled 2021-04-19: qty 20

## 2021-04-19 MED ORDER — OXYCODONE HCL 5 MG/5ML PO SOLN: 5.0000 mg | Freq: Once | ORAL | Status: AC | PRN

## 2021-04-19 MED ORDER — FENTANYL CITRATE (PF) 100 MCG/2ML IJ SOLN
INTRAMUSCULAR | Status: AC
  Filled 2021-04-19: qty 2

## 2021-04-19 MED ORDER — KETOROLAC TROMETHAMINE 30 MG/ML IJ SOLN
INTRAMUSCULAR | Status: AC
  Filled 2021-04-19: qty 1

## 2021-04-19 MED ORDER — LACTATED RINGERS IV SOLN: INTRAVENOUS | Status: DC

## 2021-04-19 MED ORDER — DEXAMETHASONE SODIUM PHOSPHATE 10 MG/ML IJ SOLN
INTRAMUSCULAR | Status: DC | PRN
  Administered 2021-04-19: 10 mg via INTRAVENOUS

## 2021-04-19 MED ORDER — ONDANSETRON HCL 4 MG/2ML IJ SOLN
INTRAMUSCULAR | Status: DC | PRN
  Administered 2021-04-19: 4 mg via INTRAVENOUS

## 2021-04-19 MED ORDER — ROPIVACAINE HCL 7.5 MG/ML IJ SOLN
INTRAMUSCULAR | Status: DC | PRN
  Administered 2021-04-19: 20 mL via PERINEURAL

## 2021-04-19 MED ORDER — PROMETHAZINE HCL 25 MG/ML IJ SOLN
6.2500 mg | INTRAMUSCULAR | Status: DC | PRN
Start: 2021-04-19 — End: 2021-04-19

## 2021-04-19 MED ORDER — LIDOCAINE 2% (20 MG/ML) 5 ML SYRINGE
INTRAMUSCULAR | Status: DC | PRN
  Administered 2021-04-19: 100 mg via INTRAVENOUS

## 2021-04-19 MED ORDER — FENTANYL CITRATE (PF) 100 MCG/2ML IJ SOLN: 25.0000 ug | INTRAMUSCULAR | Status: DC | PRN

## 2021-04-19 MED ORDER — FENTANYL CITRATE (PF) 100 MCG/2ML IJ SOLN
100.0000 ug | Freq: Once | INTRAMUSCULAR | Status: AC
Start: 2021-04-19 — End: 2021-04-19
  Administered 2021-04-19: 100 ug via INTRAVENOUS

## 2021-04-19 MED ORDER — HYDRALAZINE HCL 20 MG/ML IJ SOLN
10.0000 mg | Freq: Once | INTRAMUSCULAR | Status: AC
  Administered 2021-04-19: 10 mg via INTRAVENOUS

## 2021-04-19 MED ORDER — ONDANSETRON 4 MG PO TBDP: 4.0000 mg | ORAL_TABLET | Freq: Three times a day (TID) | ORAL | 0 refills | Status: AC | PRN

## 2021-04-19 MED ORDER — OXYCODONE HCL 5 MG PO TABS: 5.0000 mg | ORAL_TABLET | ORAL | 0 refills | Status: AC | PRN

## 2021-04-19 MED ORDER — CEFAZOLIN SODIUM-DEXTROSE 2-4 GM/100ML-% IV SOLN
2.0000 g | INTRAVENOUS | Status: AC
  Administered 2021-04-19: 2 g via INTRAVENOUS

## 2021-04-19 MED ORDER — KETOROLAC TROMETHAMINE 30 MG/ML IJ SOLN
INTRAMUSCULAR | Status: DC | PRN
  Administered 2021-04-19: 30 mg via INTRAVENOUS

## 2021-04-19 MED ORDER — SODIUM CHLORIDE 0.9 % IR SOLN
Status: DC | PRN
  Administered 2021-04-19: 6000 mL

## 2021-04-19 MED ORDER — LIDOCAINE HCL (PF) 2 % IJ SOLN
INTRAMUSCULAR | Status: AC
  Filled 2021-04-19: qty 5

## 2021-04-19 MED ORDER — OXYCODONE HCL 5 MG PO TABS
5.0000 mg | ORAL_TABLET | Freq: Once | ORAL | Status: AC | PRN
  Administered 2021-04-19: 5 mg via ORAL

## 2021-04-19 MED ORDER — FENTANYL CITRATE (PF) 100 MCG/2ML IJ SOLN
INTRAMUSCULAR | Status: DC | PRN
  Administered 2021-04-19 (×2): 50 ug via INTRAVENOUS

## 2021-04-19 MED ORDER — PROPOFOL 10 MG/ML IV BOLUS
INTRAVENOUS | Status: DC | PRN
  Administered 2021-04-19: 200 mg via INTRAVENOUS

## 2021-04-19 MED ORDER — CEFAZOLIN SODIUM-DEXTROSE 2-4 GM/100ML-% IV SOLN
INTRAVENOUS | Status: AC
  Filled 2021-04-19: qty 100

## 2021-04-19 SURGICAL SUPPLY — 45 items
BANDAGE ESMARK 6X9 LF (GAUZE/BANDAGES/DRESSINGS) IMPLANT
BLADE SHAVER TORPEDO 4X13 (MISCELLANEOUS) ×2 IMPLANT
BNDG CMPR 9X6 STRL LF SNTH (GAUZE/BANDAGES/DRESSINGS)
BNDG COHESIVE 3X5 TAN STRL LF (GAUZE/BANDAGES/DRESSINGS) ×2 IMPLANT
BNDG ELASTIC 6X5.8 VLCR STR LF (GAUZE/BANDAGES/DRESSINGS) ×2 IMPLANT
BNDG ESMARK 6X9 LF (GAUZE/BANDAGES/DRESSINGS)
BNDG GAUZE ELAST 4 BULKY (GAUZE/BANDAGES/DRESSINGS) ×2 IMPLANT
BURR CLEARCUT OVAL 5.5X13 (MISCELLANEOUS) IMPLANT
BURR OVAL 12 FL 5.5X13 (MISCELLANEOUS)
CLSR STERI-STRIP ANTIMIC 1/2X4 (GAUZE/BANDAGES/DRESSINGS) IMPLANT
COVER MAYO STAND STRL (DRAPES) ×2 IMPLANT
CUFF TOURN SGL QUICK 34 (TOURNIQUET CUFF)
CUFF TRNQT CYL 34X4.125X (TOURNIQUET CUFF) IMPLANT
DRAPE ARTHROSCOPY W/POUCH 114 (DRAPES) ×2 IMPLANT
DRAPE C-ARM 42X120 X-RAY (DRAPES) ×2 IMPLANT
DRAPE U-SHAPE 47X51 STRL (DRAPES) ×2 IMPLANT
DRSG PAD ABDOMINAL 8X10 ST (GAUZE/BANDAGES/DRESSINGS) ×4 IMPLANT
DURAPREP 26ML APPLICATOR (WOUND CARE) ×2 IMPLANT
GAUZE 4X4 16PLY ~~LOC~~+RFID DBL (SPONGE) ×2 IMPLANT
GAUZE SPONGE 4X4 12PLY STRL (GAUZE/BANDAGES/DRESSINGS) ×2 IMPLANT
GAUZE SPONGE 4X4 12PLY STRL LF (GAUZE/BANDAGES/DRESSINGS) ×2 IMPLANT
GAUZE XEROFORM 1X8 LF (GAUZE/BANDAGES/DRESSINGS) ×2 IMPLANT
GLOVE SRG 8 PF TXTR STRL LF DI (GLOVE) ×2 IMPLANT
GLOVE SURG ENC MOIS LTX SZ7.5 (GLOVE) ×4 IMPLANT
GLOVE SURG UNDER POLY LF SZ8 (GLOVE) ×4
GOWN STRL REUS W/TWL XL LVL3 (GOWN DISPOSABLE) ×4 IMPLANT
IV NS IRRIG 3000ML ARTHROMATIC (IV SOLUTION) ×4 IMPLANT
KIT ACCUFILL 5CC (Knees) ×1 IMPLANT
KIT KNEE SCP 414.502 (Knees) ×2 IMPLANT
KIT TURNOVER CYSTO (KITS) ×2 IMPLANT
MANIFOLD NEPTUNE II (INSTRUMENTS) ×2 IMPLANT
NS IRRIG 500ML POUR BTL (IV SOLUTION) ×2 IMPLANT
PACK ARTHROSCOPY DSU (CUSTOM PROCEDURE TRAY) ×2 IMPLANT
PACK BASIN DAY SURGERY FS (CUSTOM PROCEDURE TRAY) ×2 IMPLANT
PACK ICE MAXI GEL EZY WRAP (MISCELLANEOUS) ×2 IMPLANT
PORT APPOLLO RF 90DEGREE MULTI (SURGICAL WAND) IMPLANT
STRIP CLOSURE SKIN 1/2X4 (GAUZE/BANDAGES/DRESSINGS) ×2 IMPLANT
SUT ETHILON 3 0 PS 1 (SUTURE) IMPLANT
SUT ETHILON 4 0 PS 2 18 (SUTURE) ×2 IMPLANT
SUT MNCRL AB 3-0 PS2 27 (SUTURE) IMPLANT
SYR CONTROL 10ML LL (SYRINGE) IMPLANT
TOWEL OR 17X26 10 PK STRL BLUE (TOWEL DISPOSABLE) ×2 IMPLANT
TUBE CONNECTING 12X1/4 (SUCTIONS) ×2 IMPLANT
TUBING ARTHROSCOPY IRRIG 16FT (MISCELLANEOUS) ×2 IMPLANT
WAND APOLLORF SJ50 AR-9845 (SURGICAL WAND) IMPLANT

## 2021-04-19 NOTE — Progress Notes (Signed)
Assisted Dr. Rose with left, ultrasound guided, adductor canal block. Side rails up, monitors on throughout procedure. See vital signs in flow sheet. Tolerated Procedure well.  

## 2021-04-19 NOTE — Anesthesia Procedure Notes (Signed)
Anesthesia Regional Block: Adductor canal block   Pre-Anesthetic Checklist: , timeout performed,  Correct Patient, Correct Site, Correct Laterality,  Correct Procedure, Correct Position, site marked,  Risks and benefits discussed,  Surgical consent,  Pre-op evaluation,  At surgeon's request and post-op pain management  Laterality: Left  Prep: chloraprep       Needles:  Injection technique: Single-shot  Needle Type: Echogenic Needle     Needle Length: 9cm      Additional Needles:   Procedures:,,,, ultrasound used (permanent image in chart),,    Narrative:  Start time: 04/19/2021 11:58 AM End time: 04/19/2021 12:04 PM Injection made incrementally with aspirations every 5 mL.  Performed by: Personally  Anesthesiologist: Myrtie Soman, MD  Additional Notes: Patient tolerated the procedure well without complications

## 2021-04-19 NOTE — Anesthesia Procedure Notes (Signed)
Anesthesia Procedure Image    

## 2021-04-19 NOTE — Op Note (Signed)
Surgeon(s): Nicholes Stairs, MD  Assistant: Jonelle Sidle, PA-C  Assistant attestation: PA McClung present for the entire procedure.  He participated in all portions.   ANESTHESIA:  general, and regional   FLUIDS: Per anesthesia record.    ESTIMATED BLOOD LOSS: minimal     PREOPERATIVE DIAGNOSES:  1. Left knee medial meniscus tear 2.  Left medial femoral condyle insufficiency fracture 3.   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 Left knee medial meniscus tear 2.  Left medial femoral condyle insufficiency fracture 3.   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  Left knee lateral meniscus tear, acute   PROCEDURES PERFORMED:  1. Left knee arthroscopically aided treatment of medial femoral condyle insufficiency fracture with percutaneous internal fixation (subchondroplasty) [id number]) 2. Left knee arthroscopy with arthroscopic partial medial and lateral meniscectomies 3.  Left knee arthroscopic chondroplasty,  medial femoral condyle   Implant: Flowable calcium phosphate, 5 mL. Zimmer   DESCRIPTION OF PROCEDURE: The patient has a left knee medial meniscus tear. They have had pain that has been refractory to conservative management. Their preoperative MRI demonstrated subchondral bone marrow edema and insufficiency fractures of the left medial femoral condyle as well as the medial meniscus tear. Plans are to proceed with partial medial meniscectomy, internal fixation of subchondral insufficiency fractures with flowable calcium phosphate, and diagnostic arthroscopy with debridement as indicated. Full discussion held regarding risks benefits alternatives and complications related surgical intervention. Conservative care options reviewed. All questions answered.   The patient was identified in the preoperative holding area and the operative extremity was marked. The patient was brought to the operating room  and transferred to operating table in a supine position. Satisfactory general anesthesia was induced by anesthesiology.     Standard anterolateral, anteromedial arthroscopy portals were obtained. The anteromedial portal was obtained with a spinal needle for localization under direct visualization with subsequent diagnostic findings.    Anteromedial and anterolateral chambers: moderate synovitis. The synovitis was debrided with a 4.5 mm full radius shaver through both the anteromedial and lateral portals.    Suprapatellar pouch and gutters: mild synovitis or debris. Patella chondral surface: Grade 2 Trochlear chondral surface: Grade 1 Patellofemoral tracking: level Medial meniscus: posterior horn and mid body complex degenerative tearing.  With radial component back to the capsular level right at the junction of the mid body and posterior horn Medial femoral condyle flexion bearing surface: Grade 3 and 4 Medial femoral condyle extension bearing surface: Grade 2 and 3 Medial tibial plateau: Grade 2 Anterior cruciate ligament:stable Posterior cruciate ligament:stable Lateral meniscus: Horizontal free edge tearing in the mid body and anterior horn Lateral femoral condyle flexion bearing surface: Grade 1 Lateral femoral condyle extension bearing surface: Grade 0 Lateral tibial plateau: Grade 1   Medial meniscus tear was debrided using biters and motorized shaver alternating until a stable remnant was left. Upon completion the probe was used to evaluate and assess the remaining meniscus which was gleaned to be stable.  Lateral meniscus tear was debrided with partial lateral meniscectomy utilizing motorized shaver through the anterior horn and mid body.   Chondroplasty was achieved on the medial femoral condyle using a motorized shaver to debride the grade 3 unstable cartilage. Completion of the chondroplasty left A medial femoral condyle with smooth stable surface. There was no full-thickness  component noted.   Next we turned our attention to the internal fixation of the medial femoral condyle. Arthroscopically we evaluated femoral condyle  noted there was no loose cartilage or debris surrounding the lesion and the fracture did not propagate to the joint surface. Using preoperative MRI we targeted the delivery device to just under the subchondral density and in the left medial femoral condyle. This was achieved with intraoperative fluoroscopy. Once accurate placement was noted on 2 views and confirmed we delivered 2.48m of flowable calcium phosphate into the subchondral bone. We left the cannulas in place for approximately 8 minutes while the implant hardened. We removed the cannulas and again took 2 views of fluoroscopic pictures to confirm there was no extravasation outside of the bone. There was none noted.   After completion of synovectomy, diagnostic exam, and debridements as described, all compartments were checked and no residual debris remained. Hemostasis was achieved with the cautery wand. The portals were approximated with nylon suture. All excess fluid was expressed from the joint.  Xeroform sterile gauze dressings were applied followed by Ace bandage and ice pack.    There were no immediate competitions and all counts were correct.   DISPOSITION: The patient was awakened from general anesthetic, extubated, taken to the recovery room in medically stable condition, no apparent complications. The patient may be weightbearing as tolerated to the operative lower extremity with crutches.  Range of motion of right knee as tolerated.  They will use bid asa for DVT ppx, and return in 2 weeks for suture removal.   JNicholes Stairs

## 2021-04-19 NOTE — Discharge Instructions (Addendum)
Post-operative patient instructions  °Knee Arthroscopy  ° °Ice:  Place intermittent ice or cooler pack over your knee, 30 minutes on and 30 minutes off.  Continue this for the first 72 hours after surgery, then save ice for use after therapy sessions or on more active days.   °Weight:  You may bear weight on your leg as your symptoms allow. °DVT prevention: Perform ankle pumps as able throughout the day on the operative extremity.  Be mobile as possible with ambulation as able.  You should also take an 81 mg aspirin once per day x6 weeks. °Crutches:  Use crutches (or walker) to assist in walking until told to discontinue by your physical therapist or physician. This will help to reduce pain. °Strengthening:  Perform simple thigh squeezes (isometric quad contractions) and straight leg lifts as you are able (3 sets of 5 to 10 repetitions, 3 times a day).  For the leg lifts, have someone support under your ankle in the beginning until you have increased strength enough to do this on your own.  To help get started on thigh squeezes, place a pillow under your knee and push down on the pillow with back of knee (sometimes easier to do than with your leg fully straight). °Motion:  Perform gentle knee motion as tolerated - this is gentle bending and straightening of the knee. Seated heel slides: you can start by sitting in a chair, remove your brace, and gently slide your heel back on the floor - allowing your knee to bend. Have someone help you straighten your knee (or use your other leg/foot hooked under your ankle.  °Dressing:  Perform 1st dressing change at 3 days postoperative. A moderate amount of blood tinged drainage is to be expected.  So if you bleed through the dressing on the first or second day or if you have fevers, it is fine to change the dressing/check the wounds early and redress wound. Elevate your leg.  If it bleeds through again, or if the incisions are leaking frank blood, please call the office. May  change dressing every 1-2 days thereafter to help watch wounds. Can purchase Tegderm (or 3M Nexcare) water resistant dressings at local pharmacy / [facility]. °Shower:  Light shower is ok after 3 days.  Please take shower, NO bath. Recover with gauze and ace wrap to help keep wounds protected.   °Pain medication:  A narcotic pain medication has been prescribed.  Take as directed.  Typically you need narcotic pain medication more regularly during the first 3 to 5 days after surgery.  Decrease your use of the medication as the pain improves.  Narcotics can sometimes cause constipation, even after a few doses.  If you have problems with constipation, you can take an over the counter stool softener or light laxative.  If you have persistent problems, please notify your physician’s office. °Physical therapy: Additional activity guidelines to be provided by your physician or physical therapist at follow-up visits.  °Driving: Do not recommend driving x 1-2 weeks post surgical, especially if surgery performed on right side. Should not drive while taking narcotic pain medications. It typically takes at least 2 weeks to restore sufficient neuromuscular function for normal reaction times for driving safety.  °Call [phone] for questions or problems. Evenings you will be forwarded to the hospital operator.  Ask for the orthopaedic physician on call. Please call if you experience:  °  °Redness, foul smelling, or persistent drainage from the surgical site  °worsening knee pain and   swelling not responsive to medication  any calf pain and or swelling of the lower leg  temperatures greater than 101.5 F other questions or concerns   Thank you for allowing [place] to be a part of your care       Post Anesthesia Home Care Instructions  Activity: Get plenty of rest for the remainder of the day. A responsible individual must stay with you for 24 hours following the procedure.  For the next 24 hours, DO NOT: -Drive a  car -Paediatric nurse -Drink alcoholic beverages -Take any medication unless instructed by your physician -Make any legal decisions or sign important papers.  Meals: Start with liquid foods such as gelatin or soup. Progress to regular foods as tolerated. Avoid greasy, spicy, heavy foods. If nausea and/or vomiting occur, drink only clear liquids until the nausea and/or vomiting subsides. Call your physician if vomiting continues.  Special Instructions/Symptoms: Your throat may feel dry or sore from the anesthesia or the breathing tube placed in your throat during surgery. If this causes discomfort, gargle with warm salt water. The discomfort should disappear within 24 hours.    Next dose of ibuprofen after 7 pm tonight if needed    Regional Anesthesia Blocks  1. Numbness or the inability to move the "blocked" extremity may last from 3-48 hours after placement. The length of time depends on the medication injected and your individual response to the medication. If the numbness is not going away after 48 hours, call your surgeon.  2. The extremity that is blocked will need to be protected until the numbness is gone and the  Strength has returned. Because you cannot feel it, you will need to take extra care to avoid injury. Because it may be weak, you may have difficulty moving it or using it. You may not know what position it is in without looking at it while the block is in effect.  3. For blocks in the legs and feet, returning to weight bearing and walking needs to be done carefully. You will need to wait until the numbness is entirely gone and the strength has returned. You should be able to move your leg and foot normally before you try and bear weight or walk. You will need someone to be with you when you first try to ensure you do not fall and possibly risk injury.  4. Bruising and tenderness at the needle site are common side effects and will resolve in a few days.  5. Persistent  numbness or new problems with movement should be communicated to the surgeon or the [place] [phone] [place] [phone]).

## 2021-04-19 NOTE — Anesthesia Procedure Notes (Addendum)
Procedure Name: LMA Insertion Date/Time: 04/19/2021 12:51 PM Performed by: Rogers Blocker, CRNA Pre-anesthesia Checklist: Patient identified, Emergency Drugs available, Suction available and Patient being monitored Patient Re-evaluated:Patient Re-evaluated prior to induction Oxygen Delivery Method: Circle System Utilized Preoxygenation: Pre-oxygenation with 100% oxygen Induction Type: IV induction Ventilation: Mask ventilation without difficulty LMA: LMA inserted LMA Size: 5.0 Number of attempts: 1 Airway Equipment and Method: Bite block Placement Confirmation: positive ETCO2 Tube secured with: Tape Dental Injury: Teeth and Oropharynx as per pre-operative assessment  Comments: Audible wheezing after induction, improved within 2 minutes

## 2021-04-19 NOTE — Anesthesia Postprocedure Evaluation (Signed)
Anesthesia Post Note  Patient: KEELON BUEHL  Procedure(s) Performed: KNEE ARTHROSCOPY WITH PARTIAL MEDIAL MENISECECTOMY AND MEDIAL FEMUR SUBCHONDROPLASTY (Left: Knee)     Patient location during evaluation: PACU Anesthesia Type: General Level of consciousness: awake and alert and oriented Pain management: pain level controlled Vital Signs Assessment: post-procedure vital signs reviewed and stable Respiratory status: spontaneous breathing, nonlabored ventilation and respiratory function stable Cardiovascular status: blood pressure returned to baseline Postop Assessment: no apparent nausea or vomiting Anesthetic complications: no   No notable events documented.  Last Vitals:  Vitals:   04/19/21 1430 04/19/21 1445  BP: (!) 140/92 (!) 144/84  Pulse: (!) 51 (!) 54  Resp:    Temp:    SpO2: 99% 97%    Last Pain:  Vitals:   04/19/21 1430  TempSrc:   PainSc: [place]

## 2021-04-19 NOTE — Transfer of Care (Signed)
Immediate Anesthesia Transfer of Care Note  Patient: Patrick Edwards  Procedure(s) Performed: KNEE ARTHROSCOPY WITH PARTIAL MEDIAL MENISECECTOMY AND MEDIAL FEMUR SUBCHONDROPLASTY (Left: Knee)  Patient Location: PACU  Anesthesia Type:General  Level of Consciousness: drowsy  Airway & Oxygen Therapy: Patient Spontanous Breathing and Patient connected to nasal cannula oxygen  Post-op Assessment: Report given to RN  Post vital signs: Reviewed and stable  Last Vitals:  Vitals Value Taken Time  BP 144/99 04/19/21 1353  Temp    Pulse 76   Resp 7 04/19/21 1355  SpO2 98%   Vitals shown include unvalidated device data.  Last Pain:  Vitals:   04/19/21 1032  TempSrc: Oral  PainSc: 0-No pain      Patients Stated Pain Goal: 6 ([id number])  Complications: No notable events documented.

## 2021-04-19 NOTE — Anesthesia Preprocedure Evaluation (Signed)
Anesthesia Evaluation  Patient identified by MRN, date of birth, ID band Patient awake    Reviewed: Allergy & Precautions, NPO status , Patient's Chart, lab work & pertinent test results  Airway Mallampati: II  TM Distance: >3 FB Neck ROM: Full    Dental no notable dental hx.    Pulmonary neg pulmonary ROS,    Pulmonary exam normal breath sounds clear to auscultation       Cardiovascular hypertension, Normal cardiovascular exam Rhythm:Regular Rate:Normal  Uncontrolled HTN   Neuro/Psych negative neurological ROS  negative psych ROS   GI/Hepatic negative GI ROS, Neg liver ROS,   Endo/Other  negative endocrine ROS  Renal/GU negative Renal ROS  negative genitourinary   Musculoskeletal negative musculoskeletal ROS (+)   Abdominal   Peds negative pediatric ROS (+)  Hematology negative hematology ROS (+)   Anesthesia Other Findings   Reproductive/Obstetrics negative OB ROS                             Anesthesia Physical Anesthesia Plan  ASA: 3  Anesthesia Plan: General   Post-op Pain Management:  Regional for Post-op pain   Induction: Intravenous  PONV Risk Score and Plan: 2 and Ondansetron, Dexamethasone and Treatment may vary due to age or medical condition  Airway Management Planned: LMA  Additional Equipment:   Intra-op Plan:   Post-operative Plan: Extubation in OR  Informed Consent: I have reviewed the patients History and Physical, chart, labs and discussed the procedure including the risks, benefits and alternatives for the proposed anesthesia with the patient or authorized representative who has indicated his/her understanding and acceptance.     Dental advisory given  Plan Discussed with: CRNA and Surgeon  Anesthesia Plan Comments:         Anesthesia Quick Evaluation

## 2021-04-19 NOTE — H&P (Signed)
ORTHOPAEDIC H&P  REQUESTING PHYSICIAN: Nicholes Stairs, MD  PCP:  [name], MD  Chief Complaint: Left knee pain  HPI: Patrick Edwards is a 60 y.o. male who complains of recalcitrant left knee pain.  Here today for arthroscopic intervention on the left knee including partial meniscectomy with medial femur condyle subchondral plasty.  No new complaints.  Past Medical History:  Diagnosis Date   Acute meniscal tear of left knee    ED (erectile dysfunction)    Heart murmur    per pt was told approx. greater than 2012 ago by pcp, but no work-up was done   Hemorrhoids    History of prostate cancer 06/2016   dx. with biopsy 06-02-16, gleason 3+4.;   s/p  radical prostatectomy 07-31-2016   Hypertension    Left-sided Bell's palsy 1982   per pt residual  facial weakness"slight"   Nocturia    x 2-3 nightly   Past Surgical History:  Procedure Laterality Date   LYMPHADENECTOMY Bilateral 07/31/2016   Procedure: PELVIC LYMPHADENECTOMY;  Surgeon: Alexis Frock, MD;  Location: [facility];  Service: Urology;  Laterality: Bilateral;   ROBOT ASSISTED LAPAROSCOPIC RADICAL PROSTATECTOMY N/A 07/31/2016   Procedure: XI ROBOTIC ASSISTED LAPAROSCOPIC RADICAL PROSTATECTOMY WITH INDOCYANINE GREEN DYE INJECTION;  Surgeon: Alexis Frock, MD;  Location: [facility];  Service: Urology;  Laterality: N/A;   TAYLOR BUNIONECTOMY Bilateral 1979   TONSILLECTOMY     child   Social History   Socioeconomic History   Marital status: Married    Spouse name: Not on file   Number of children: Not on file   Years of education: Not on file   Highest education level: Not on file  Occupational History   Not on file  Tobacco Use   Smoking status: Never   Smokeless tobacco: Never  Vaping Use   Vaping Use: Never used  Substance and Sexual Activity   Alcohol use: No   Drug use: Never   Sexual activity: Yes  Other Topics Concern   Not on file  Social History Narrative   Not on file   Social Determinants  of Health   Financial Resource Strain: Not on file  Food Insecurity: Not on file  Transportation Needs: Not on file  Physical Activity: Not on file  Stress: Not on file  Social Connections: Not on file   History reviewed. No pertinent family history. No Known Allergies Prior to Admission medications   Medication Sig Start Date End Date Taking? Authorizing Provider  atorvastatin (LIPITOR) 10 MG tablet Take 10 mg by mouth daily.   Yes [provider]  Cholecalciferol (VITAMIN D3 PO) Take by mouth daily.   Yes [provider]  hydrochlorothiazide (HYDRODIURIL) 25 MG tablet Take 25 mg by mouth daily.   Yes [provider]  Multiple Vitamin (MULTIVITAMIN) tablet Take 1 tablet by mouth daily.   Yes [provider]  Multiple Vitamins-Minerals (ZINC PO) Take by mouth daily.   Yes [provider]  SILDENAFIL CITRATE PO Take by mouth as needed (ed).   Yes [provider]   No results found.  Positive ROS: All other systems have been reviewed and were otherwise negative with the exception of those mentioned in the HPI and as above.  Physical Exam: General: Alert, no acute distress Cardiovascular: No pedal edema Respiratory: No cyanosis, no use of accessory musculature GI: No organomegaly, abdomen is soft and non-tender Skin: No lesions in the area of chief complaint Neurologic: Sensation intact distally Psychiatric:  Patient is competent for consent with normal mood and affect Lymphatic: No axillary or cervical lymphadenopathy  MUSCULOSKELETAL: Left lower extremity is warm and well-perfused with no open wounds or skin lesions.  Assessment: 1.  Left knee acute medial meniscus tear  2.  Left knee medial femoral condyle stress reaction  Plan: -Plan to proceed today with left knee arthroscopy with partial medial meniscectomy and debridement as indicated as well as medial femoral condyle subchondral plasty.  -Risk and benefits discussed  with patient including but not limited to bleeding, infection, damage to surrounding nerves and vessels, stiffness, arthritis, fracture, DVT, the risk of anesthesia.  Informed consent was obtained.  -Discharge home postop.    Nicholes Stairs, MD Cell [phone]    04/19/2021 12:39 PM

## 2021-04-19 NOTE — Brief Op Note (Signed)
04/19/2021  1:37 PM  PATIENT:  Patrick Edwards  60 y.o. male  PRE-OPERATIVE DIAGNOSIS:  Left knee medial meniscus tear, medial femoral condyle stress fracture  POST-OPERATIVE DIAGNOSIS:  Left knee medial meniscus tear, left lateral meniscus tear, medial femoral condyle stress fracture  PROCEDURE:  Procedure(s) with comments: KNEE ARTHROSCOPY WITH PARTIAL MEDIAL MENISECECTOMY AND MEDIAL FEMUR SUBCHONDROPLASTY (Left) - 107mn  SURGEON:  Surgeon(s) and Role:    * RNicholes Stairs MD - Primary  PHYSICIAN ASSISTANT: KJonelle Sidle PA-C  ANESTHESIA:   regional and general  EBL:  10 cc  BLOOD ADMINISTERED:none  DRAINS: none   LOCAL MEDICATIONS USED:  NONE  SPECIMEN:  No Specimen  DISPOSITION OF SPECIMEN:  N/A  COUNTS:  YES  TOURNIQUET:  * Missing tourniquet times found for documented tourniquets in log: [id number]*  DICTATION: .Note written in EPIC  PLAN OF CARE: Discharge to home after PACU  PATIENT DISPOSITION:  PACU - hemodynamically stable.   Delay start of Pharmacological VTE agent (>24hrs) due to surgical blood loss or risk of bleeding: not applicable

## 2021-04-22 ENCOUNTER — Encounter (HOSPITAL_BASED_OUTPATIENT_CLINIC_OR_DEPARTMENT_OTHER): Payer: Self-pay | Admitting: Orthopedic Surgery

## 2021-05-24 DIAGNOSIS — M25562 Pain in left knee: Secondary | ICD-10-CM | POA: Diagnosis not present

## 2021-05-27 DIAGNOSIS — M25562 Pain in left knee: Secondary | ICD-10-CM | POA: Diagnosis not present

## 2021-05-30 DIAGNOSIS — M25562 Pain in left knee: Secondary | ICD-10-CM | POA: Diagnosis not present

## 2021-06-04 DIAGNOSIS — M25562 Pain in left knee: Secondary | ICD-10-CM | POA: Diagnosis not present

## 2021-06-06 DIAGNOSIS — M25562 Pain in left knee: Secondary | ICD-10-CM | POA: Diagnosis not present

## 2021-06-11 DIAGNOSIS — M25562 Pain in left knee: Secondary | ICD-10-CM | POA: Diagnosis not present

## 2021-06-13 DIAGNOSIS — M25562 Pain in left knee: Secondary | ICD-10-CM | POA: Diagnosis not present

## 2021-06-18 DIAGNOSIS — M25562 Pain in left knee: Secondary | ICD-10-CM | POA: Diagnosis not present

## 2021-06-18 DIAGNOSIS — C61 Malignant neoplasm of prostate: Secondary | ICD-10-CM | POA: Diagnosis not present

## 2021-06-20 DIAGNOSIS — M25562 Pain in left knee: Secondary | ICD-10-CM | POA: Diagnosis not present

## 2021-06-25 DIAGNOSIS — C61 Malignant neoplasm of prostate: Secondary | ICD-10-CM | POA: Diagnosis not present

## 2021-06-25 DIAGNOSIS — N5201 Erectile dysfunction due to arterial insufficiency: Secondary | ICD-10-CM | POA: Diagnosis not present

## 2021-06-25 DIAGNOSIS — N393 Stress incontinence (female) (male): Secondary | ICD-10-CM | POA: Diagnosis not present

## 2021-07-15 ENCOUNTER — Other Ambulatory Visit: Payer: Self-pay

## 2021-07-15 ENCOUNTER — Ambulatory Visit
Admission: EM | Admit: 2021-07-15 | Discharge: 2021-07-15 | Disposition: A | Discharge status: Home / Self Care | Payer: Federal, State, Local not specified - PPO | Attending: Internal Medicine | Admitting: Internal Medicine

## 2021-07-15 DIAGNOSIS — I10 Essential (primary) hypertension: Secondary | ICD-10-CM | POA: Insufficient documentation

## 2021-07-15 LAB — CBC WITH DIFFERENTIAL/PLATELET
Abs Immature Granulocytes: 0.02 10*3/uL (ref 0.00–0.07)
Basophils Absolute: 0.1 10*3/uL (ref 0.0–0.1)
Basophils Relative: 1 %
Eosinophils Absolute: 0.2 10*3/uL (ref 0.0–0.5)
Eosinophils Relative: 3 %
HCT: 43.4 % (ref 39.0–52.0)
Hemoglobin: 14.3 g/dL (ref 13.0–17.0)
Immature Granulocytes: 0 %
Lymphocytes Relative: 23 %
Lymphs Abs: 1.5 10*3/uL (ref 0.7–4.0)
MCH: 29.5 pg (ref 26.0–34.0)
MCHC: 32.9 g/dL (ref 30.0–36.0)
MCV: 89.5 fL (ref 80.0–100.0)
Monocytes Absolute: 0.9 10*3/uL (ref 0.1–1.0)
Monocytes Relative: 14 %
Neutro Abs: 3.8 10*3/uL (ref 1.7–7.7)
Neutrophils Relative %: 59 %
Platelets: 145 10*3/uL — ABNORMAL LOW (ref 150–400)
RBC: 4.85 MIL/uL (ref 4.22–5.81)
RDW: 12.8 % (ref 11.5–15.5)
WBC: 6.4 10*3/uL (ref 4.0–10.5)
nRBC: 0 % (ref 0.0–0.2)

## 2021-07-15 LAB — COMPREHENSIVE METABOLIC PANEL
ALT: 25 U/L (ref 0–44)
AST: 33 U/L (ref 15–41)
Albumin: 4.4 g/dL (ref 3.5–5.0)
Alkaline Phosphatase: 68 U/L (ref 38–126)
Anion gap: 11 (ref 5–15)
BUN: 18 mg/dL (ref 6–20)
CO2: 27 mmol/L (ref 22–32)
Calcium: 9.2 mg/dL (ref 8.9–10.3)
Chloride: 100 mmol/L (ref 98–111)
Creatinine, Ser: 1.48 mg/dL — ABNORMAL HIGH (ref 0.61–1.24)
GFR, Estimated: 54 mL/min — ABNORMAL LOW (ref >60)
Glucose, Bld: 77 mg/dL (ref 70–99)
Potassium: 3.7 mmol/L (ref 3.5–5.1)
Sodium: 138 mmol/L (ref 135–145)
Total Bilirubin: 1.2 mg/dL (ref 0.3–1.2)
Total Protein: 7.1 g/dL (ref 6.5–8.1)

## 2021-07-15 LAB — URINALYSIS, COMPLETE (UACMP) WITH MICROSCOPIC
Bilirubin Urine: NEGATIVE
Glucose, UA: NEGATIVE mg/dL
Ketones, ur: 40 mg/dL — AB
Leukocytes,Ua: NEGATIVE
Nitrite: NEGATIVE
Protein, ur: NEGATIVE mg/dL
Specific Gravity, Urine: >1.03 — ABNORMAL HIGH (ref 1.005–1.030)
pH: 5.5 (ref 5.0–8.0)

## 2021-07-15 MED ORDER — LISINOPRIL 5 MG PO TABS: 5.0000 mg | ORAL_TABLET | Freq: Every day | ORAL | 0 refills | Status: AC

## 2021-07-15 NOTE — ED Provider Notes (Signed)
[facility]    CSN: [id number] Arrival date & time: 07/15/21  1608      History   Chief Complaint Chief Complaint  Patient presents with   Hypertension    HPI Patrick Edwards is a 60 y.o. male who checked his BP at his friend's house and was 190/110. He has no symptoms and only did it since his friend was checking his. His BP in the past year has been around 130's/80's. Had knee surgery in August and at that time was very high and he almost could not have the surgery, but they got it controlled and had the surgery, but since then he is aware had been elevated, but has not checked it at home.   When he got up this am was 185/90 without medication, and after working out 182/92. He denies, HA, SOB, CP, edema.    Past Medical History:  Diagnosis Date   Acute meniscal tear of left knee    ED (erectile dysfunction)    Heart murmur    per pt was told approx. greater than 2012 ago by pcp, but no work-up was done   Hemorrhoids    History of prostate cancer 06/2016   dx. with biopsy 06-02-16, gleason 3+4.;   s/p  radical prostatectomy 07-31-2016   Hypertension    Left-sided Bell's palsy 1982   per pt residual  facial weakness"slight"   Nocturia    x 2-3 nightly    Patient Active Problem List   Diagnosis Date Noted   Prostate cancer ([place]) 07/31/2016    Past Surgical History:  Procedure Laterality Date   KNEE ARTHROSCOPY WITH SUBCHONDROPLASTY Left 04/19/2021   Procedure: KNEE ARTHROSCOPY WITH PARTIAL MEDIAL [place] AND MEDIAL FEMUR SUBCHONDROPLASTY;  Surgeon: Nicholes Stairs, MD;  Location: [facility];  Service: Orthopedics;  Laterality: Left;  18min   LYMPHADENECTOMY Bilateral 07/31/2016   Procedure: PELVIC LYMPHADENECTOMY;  Surgeon: Alexis Frock, MD;  Location: [facility];  Service: Urology;  Laterality: Bilateral;   ROBOT ASSISTED LAPAROSCOPIC RADICAL PROSTATECTOMY N/A 07/31/2016   Procedure: XI ROBOTIC ASSISTED LAPAROSCOPIC RADICAL  PROSTATECTOMY WITH INDOCYANINE GREEN DYE INJECTION;  Surgeon: Alexis Frock, MD;  Location: [facility];  Service: Urology;  Laterality: N/A;   TAYLOR BUNIONECTOMY Bilateral 1979   TONSILLECTOMY     child       Home Medications    Prior to Admission medications   Medication Sig Start Date End Date Taking? Authorizing Provider  atorvastatin (LIPITOR) 10 MG tablet Take 10 mg by mouth daily.   Yes [provider]  Cholecalciferol (VITAMIN D3 PO) Take by mouth daily.   Yes [provider]  hydrochlorothiazide (HYDRODIURIL) 25 MG tablet Take 25 mg by mouth daily.   Yes [provider]  Multiple Vitamin (MULTIVITAMIN) tablet Take 1 tablet by mouth daily.   Yes [provider]  Multiple Vitamins-Minerals (ZINC PO) Take by mouth daily.   Yes [provider]  SILDENAFIL CITRATE PO Take by mouth as needed (ed).   Yes [provider]  meloxicam (MOBIC) 7.5 MG tablet Take 7.5 mg by mouth 2 (two) times daily. 05/31/21   [provider]  ondansetron (ZOFRAN ODT) 4 MG disintegrating tablet Take 1 tablet (4 mg total) by mouth every 8 (eight) hours as needed for nausea or vomiting. 04/19/21   Nicholes Stairs, MD  oxyCODONE (ROXICODONE) 5 MG immediate release tablet Take 1 tablet (5 mg total) by mouth every 4 (four) hours as needed for severe pain.  04/19/21 04/19/22  Nicholes Stairs, MD    Family History History reviewed. No pertinent family history.  Social History Social History   Tobacco Use   Smoking status: Never   Smokeless tobacco: Never  Vaping Use   Vaping Use: Never used  Substance Use Topics   Alcohol use: No   Drug use: Never     Allergies   Patient has no known allergies.   Review of Systems Review of Systems Review of Systems  Constitutional: Negative for diaphoresis and unexpected weight change.  HENT: Negative for tinnitus.   Eyes: Negative for visual disturbance.  Respiratory: Negative for chest  tightness and shortness of breath.   Cardiovascular: Negative for chest pain, palpitations and leg swelling.  Gastrointestinal: Negative for constipation, diarrhea and nausea.  Endocrine: Negative for polydipsia, polyphagia and polyuria.  Genitourinary: Negative for dysuria and frequency.  Skin: Negative for rash and wound.  Neurological: Negative for dizziness, speech difficulty, weakness, numbness and headaches.    Physical Exam Triage Vital Signs ED Triage Vitals [07/15/21 1749]  Enc Vitals Group     BP (!) 170/105     Pulse Rate 63     Resp 18     Temp 98.2 F (36.8 C)     Temp Source Oral     SpO2 98 %     Weight      Height      Head Circumference      Peak Flow      Pain Score 0     Pain Loc      Pain Edu?      Excl. in [place]?    No data found.  Updated Vital Signs BP (!) 170/105 (BP Location: Left Arm)   Pulse 63   Temp 98.2 F (36.8 C) (Oral)   Resp 18   SpO2 98%   Visual Acuity Right Eye Distance:   Left Eye Distance:   Bilateral Distance:    Right Eye Near:   Left Eye Near:    Bilateral Near:     Physical Exam  Constitutional: He is oriented to person, place, and time. He appears well-developed and well-nourished. No distress.  HENT:  Head: Normocephalic and atraumatic.  Right Ear: External ear normal.  Left Ear: External ear normal.  Nose: Nose normal.  Eyes: Conjunctivae are normal. Right eye exhibits no discharge. Left eye exhibits no discharge. No scleral icterus.  Neck: Neck supple. No thyromegaly present.  No carotid bruits bilaterally  Cardiovascular: Normal rate and regular rhythm.  No murmur heard. Pulmonary/Chest: Effort normal and breath sounds normal. No respiratory distress.  Musculoskeletal: Normal range of motion. He exhibits no edema.  Lymphadenopathy: he has no cervical adenopathy.  Neurological: he is alert and oriented to person, place, and time.  Skin: Skin is warm and dry. Capillary refill takes less than 2 seconds. No rash  noted. He is not diaphoretic.  Psychiatric: he has a normal mood and affect. His behavior is normal. Judgment and thought content normal.  Nursing note reviewed.   UC Treatments / Results  Labs (all labs ordered are listed, but only abnormal results are displayed) Labs Reviewed  URINALYSIS, COMPLETE (UACMP) WITH MICROSCOPIC  CBC WITH DIFFERENTIAL/PLATELET  COMPREHENSIVE METABOLIC PANEL    EKG Sinus brady with occasional PVC's  Radiology No results found.  Procedures Procedures (including critical care time)  Medications Ordered in UC Medications - No data to display  Initial Impression / Assessment and Plan / UC Course  I have  reviewed the triage vital signs and the nursing notes. Pertinent labs & imaging results that were available during my care of the patient were reviewed by me and considered in my medical decision making (see chart for details). Uncontrolled HTN I placed him on Lisinopril to add to his diuretic, advised to d/c Mobic, drink more fluids and FU with pcp in 2 weeks.  See instructions.   Final Clinical Impressions(s) / UC Diagnoses   Final diagnoses:  None   Discharge Instructions   None    ED Prescriptions   None    PDMP not reviewed this encounter.   Shelby Mattocks, Hershal Coria 07/15/21 2007

## 2021-07-15 NOTE — ED Triage Notes (Signed)
Pt here with C/O HBP, Pt checked BP yesterday 195/110. PCP told him to get it checked out tonight.

## 2021-07-15 NOTE — Discharge Instructions (Addendum)
Your cell count is all normal except your platelets are a little low, at 145K, but better than your last one from 12/2018. The normal range  is from 150K- 400k Your urine shows trace blood and few bacterial, so I am sending it for a culture to check for infection.  Your EKG shows slow heart rate and occasional skip beats called premature ventricular beats which is of no concern. I could not pull an old EKG to compare it.  Your chemistry test shows stable renal function compared to last one in August, but is it still elevated. I advise you to stop the Meloxicam and only take Tylenol as needed for pain and hydrate more before you have your lab work rechecked.  See your family Dr in 2 weeks for your blood pressure. In the mean time check your blood pressure 2-3 hours after taking your blood pressure medication.

## 2021-07-17 LAB — URINE CULTURE: Culture: NO GROWTH

## 2021-07-31 DIAGNOSIS — N5201 Erectile dysfunction due to arterial insufficiency: Secondary | ICD-10-CM | POA: Diagnosis not present

## 2021-08-06 DIAGNOSIS — M25562 Pain in left knee: Secondary | ICD-10-CM | POA: Diagnosis not present

## 2021-08-07 DIAGNOSIS — I10 Essential (primary) hypertension: Secondary | ICD-10-CM | POA: Diagnosis not present

## 2021-08-21 DIAGNOSIS — I10 Essential (primary) hypertension: Secondary | ICD-10-CM | POA: Diagnosis not present

## 2021-08-21 DIAGNOSIS — Z23 Encounter for immunization: Secondary | ICD-10-CM | POA: Diagnosis not present

## 2021-09-24 DIAGNOSIS — I10 Essential (primary) hypertension: Secondary | ICD-10-CM | POA: Diagnosis not present

## 2021-10-08 DIAGNOSIS — R1032 Left lower quadrant pain: Secondary | ICD-10-CM | POA: Diagnosis not present

## 2021-10-08 DIAGNOSIS — I10 Essential (primary) hypertension: Secondary | ICD-10-CM | POA: Diagnosis not present

## 2021-12-19 DIAGNOSIS — I10 Essential (primary) hypertension: Secondary | ICD-10-CM | POA: Diagnosis not present

## 2021-12-19 DIAGNOSIS — Z131 Encounter for screening for diabetes mellitus: Secondary | ICD-10-CM | POA: Diagnosis not present

## 2021-12-19 DIAGNOSIS — R0789 Other chest pain: Secondary | ICD-10-CM | POA: Diagnosis not present

## 2021-12-19 DIAGNOSIS — Z0000 Encounter for general adult medical examination without abnormal findings: Secondary | ICD-10-CM | POA: Diagnosis not present

## 2021-12-19 DIAGNOSIS — E7800 Pure hypercholesterolemia, unspecified: Secondary | ICD-10-CM | POA: Diagnosis not present

## 2021-12-24 DIAGNOSIS — C61 Malignant neoplasm of prostate: Secondary | ICD-10-CM | POA: Diagnosis not present

## 2021-12-31 DIAGNOSIS — C61 Malignant neoplasm of prostate: Secondary | ICD-10-CM | POA: Diagnosis not present

## 2021-12-31 DIAGNOSIS — N5201 Erectile dysfunction due to arterial insufficiency: Secondary | ICD-10-CM | POA: Diagnosis not present

## 2021-12-31 DIAGNOSIS — N393 Stress incontinence (female) (male): Secondary | ICD-10-CM | POA: Diagnosis not present

## 2021-12-31 NOTE — Progress Notes (Signed)
?Cardiology Office Note:   ? ?Date:  01/01/2022  ? ?ID:  Patrick Edwards, DOB Jul 11, 1961, MRN [id number] ? ?PCP:  Patrick Amel, MD  ? ?[facility] Providers ?Cardiologist:  Patrick Sciara, MD ?Referring MD: Patrick Amel, MD  ? ?Chief Complaint/Reason for Referral: Chest discomfort ? ?ASSESSMENT:   ? ?1. Precordial pain   ?2. Sinus bradycardia   ?3. Stage 3b chronic kidney disease ([place])   ?4. Prostate cancer ([place])   ?5. Hyperlipidemia, unspecified hyperlipidemia type   ?6. Hypertension, unspecified type   ? ? ?PLAN:   ? ?In order of problems listed above: ?1.  Chest pain: I will obtain an exercise treadmill stress test and echocardiogram to evaluate further.  Only if the patient has high risk findings would further evaluation be needed.  I will have the patient follow-up with me in 6 months or earlier if needed. ?2.  Sinus bradycardia: TSH was within normal limits.  Monitor for now.  I think this is likely due to the fact the patient is in very good physical condition. ?3.  Chronic kidney disease stage IIIb: Strict blood pressure control.  ACE inhibitor. ?4.  Prostate cancer: This is being managed by other providers with serial PSAs. ?5.  Hyperlipidemia: This is being monitored by the patient's primary care provider.  His LDL recently was in the 90s. ?6.  Hypertension: Patient's blood pressure today shows an elevated diastolic blood pressure.  If this is above goal the next time he sees a provider his antihypertensives should be adjusted. ?     ? ?Shared Decision Making/Informed Consent ?The risks [chest pain, shortness of breath, cardiac arrhythmias, dizziness, blood pressure fluctuations, myocardial infarction, stroke/transient ischemic attack, and life-threatening complications (estimated to be 1 in 10,000)], benefits (risk stratification, diagnosing coronary artery disease, treatment guidance) and alternatives of an exercise tolerance test were discussed in detail with Mr. Patrick Edwards and he agrees to proceed.   ? ?Dispo:  No follow-ups on file.  ? ?  ? ?Medication Adjustments/Labs and Tests Ordered: ?Current medicines are reviewed at length with the patient today.  Concerns regarding medicines are outlined above. ? ?The following changes have been made:  no change  ? ?Labs/tests ordered: ?Orders Placed This Encounter  ?Procedures  ? EKG 12-Lead  ? ? ?Medication Changes: ?No orders of the defined types were placed in this encounter. ? ? ? ?Current medicines are reviewed at length with the patient today.  The patient does not have concerns regarding medicines. ? ? ?History of Present Illness:   ? ?FOCUSED PROBLEM LIST:   ?1.  Hypertension ?2.  Hyperlipidemia ?3.  Prostate cancer status post prostatectomy ?4.  BMI 31 ?5.  Chronic kidney disease with GFR 42 ? ?The patient is a 61 y.o. male with the indicated medical history here for sinus bradycardia and atypical chest discomfort.  The patient was seen by his primary care provider recently.  An EKG and labs were performed.  His EKG demonstrated sinus bradycardia which is likely his EKG in 2017 which also showed sinus bradycardia.  Labs were drawn including a TSH which was within normal limits.  CMP demonstrated chronic kidney disease with a creatinine of 1.8.  His lipid panel demonstrated cholesterol 159, triglycerides 51, HDL 56, and LDL 93. ? ?The patient is in excellent physical condition.  He works out on a routine basis.  He runs 3-1/2 miles in about 30 minutes 3 times a week.  He lifts weights and does other aerobic activity without any limitations.  The patient tells me that on 2 occasions he has had some chest discomfort.  They both typically been at rest.  He noticed a sharp chest discomfort that was not associated with shortness of breath.  This seemed to go away on its own a few minutes later.  Fortunately he has not required any hospitalizations or emergency room visits.  He recently had his lisinopril and hydrochlorothiazide discontinued by his primary care  provider due to excellent blood pressure control. ? ? ?Current Medications: ?Current Meds  ?Medication Sig  ? atorvastatin (LIPITOR) 10 MG tablet Take 10 mg by mouth daily.  ? Cholecalciferol (VITAMIN D3 PO) Take by mouth daily.  ? Multiple Vitamin (MULTIVITAMIN) tablet Take 1 tablet by mouth daily.  ? Multiple Vitamins-Minerals (ZINC PO) Take by mouth daily.  ? SILDENAFIL CITRATE PO Take by mouth as needed (ed).  ?  ? ?Allergies:    ?Patient has no known allergies.  ? ?Social History:   ?Social History  ? ?Tobacco Use  ? Smoking status: Never  ? Smokeless tobacco: Never  ?Vaping Use  ? Vaping Use: Never used  ?Substance Use Topics  ? Alcohol use: No  ? Drug use: Never  ?  ? ?Family Hx: ?History reviewed. No pertinent family history.  ? ?Review of Systems:   ?Please see the history of present illness.    ?All other systems reviewed and are negative. ?  ? ? ?EKGs/Labs/Other Test Reviewed:   ? ?EKG:  EKG performed 2017 that I personally reviewed demonstrates bradycardia with possible left atrial enlargement; EKG performed December 19, 2021 that I personally reviewed demonstrates sinus bradycardia.  EKG today which I reviewed shows sinus bradycardia with inferior infarction pattern ? ?Prior CV studies: ?None available ? ?Other studies Reviewed: ?Review of the additional studies/records demonstrates: Ultrasound abdomen 2021 without aortic atherosclerosis or aneurysm ? ?Recent Labs: ?07/15/2021: ALT 25; BUN 18; Creatinine, Ser 1.48; Hemoglobin 14.3; Platelets 145; Potassium 3.7; Sodium 138  ? ?Recent Lipid Panel ?No results found for: CHOL, TRIG, HDL, LDLCALC, LDLDIRECT ? ?Risk Assessment/Calculations:   ? ?  ?    ? ?Physical Exam:   ? ?VS:  BP 126/90   Pulse (!) 48   Ht 5' 8.5" (1.74 m)   Wt 202 lb 9.6 oz (91.9 kg)   SpO2 96%   BMI 30.36 kg/m?    ?Wt Readings from Last 3 Encounters:  ?01/01/22 202 lb 9.6 oz (91.9 kg)  ?04/19/21 210 lb 4.8 oz (95.4 kg)  ?12/22/16 207 lb (93.9 kg)  ?  ?GENERAL:  No apparent distress,  AOx3 ?HEENT:  No carotid bruits, +2 carotid impulses, no scleral icterus ?CAR: RRR no murmurs, gallops, rubs, or thrills ?RES:  Clear to auscultation bilaterally ?ABD:  Soft, nontender, nondistended, positive bowel sounds x 4 ?VASC:  +2 radial pulses, +2 carotid pulses, palpable pedal pulses ?NEURO:  CN 2-12 grossly intact; motor and sensory grossly intact ?PSYCH:  No active depression or anxiety ?EXT:  No edema, ecchymosis, or cyanosis ? ?Signed, ?Early Osmond, MD  ?01/01/2022 1:55 PM    ?[facility] ?[address], [address], [place]  [address] ?Phone: [phone]; Fax: [phone]  ? ?Note:  This document was prepared using Dragon voice recognition software and may include unintentional dictation errors. ?

## 2022-01-01 ENCOUNTER — Ambulatory Visit: Payer: Federal, State, Local not specified - PPO | Admitting: Internal Medicine

## 2022-01-01 ENCOUNTER — Encounter: Payer: Self-pay | Admitting: Internal Medicine

## 2022-01-01 VITALS — BP 126/90 | HR 48 | Ht 68.5 in | Wt 202.6 lb

## 2022-01-01 DIAGNOSIS — C61 Malignant neoplasm of prostate: Secondary | ICD-10-CM

## 2022-01-01 DIAGNOSIS — N1832 Chronic kidney disease, stage 3b: Secondary | ICD-10-CM

## 2022-01-01 DIAGNOSIS — R072 Precordial pain: Secondary | ICD-10-CM

## 2022-01-01 DIAGNOSIS — R001 Bradycardia, unspecified: Secondary | ICD-10-CM | POA: Diagnosis not present

## 2022-01-01 DIAGNOSIS — I10 Essential (primary) hypertension: Secondary | ICD-10-CM

## 2022-01-01 DIAGNOSIS — E785 Hyperlipidemia, unspecified: Secondary | ICD-10-CM

## 2022-01-01 NOTE — Patient Instructions (Signed)
Medication Instructions:  ?Your physician recommends that you continue on your current medications as directed. Please refer to the Current Medication list given to you today. ? ?*If you need a refill on your cardiac medications before your next appointment, please call your pharmacy* ? ? ?Lab Work: ?None ordered ?If you have labs (blood work) drawn today and your tests are completely normal, you will receive your results only by: ?MyChart Message (if you have MyChart) OR ?A paper copy in the mail ?If you have any lab test that is abnormal or we need to change your treatment, we will call you to review the results. ? ? ?Testing/Procedures: ?Your physician has requested that you have an echocardiogram. Echocardiography is a painless test that uses sound waves to create images of your heart. It provides your doctor with information about the size and shape of your heart and how well your heart?s chambers and valves are working. This procedure takes approximately one hour. There are no restrictions for this procedure. ? ?Your physician has requested that you have an exercise tolerance test. For further information please visit [email or web address]. Please also follow instruction sheet, as given. ? ? ?Follow-Up: ?At [facility], you and your health needs are our priority.  As part of our continuing mission to provide you with exceptional heart care, we have created designated Provider Care Teams.  These Care Teams include your primary Cardiologist (physician) and Advanced Practice Providers (APPs -  Physician Assistants and Nurse Practitioners) who all work together to provide you with the care you need, when you need it. ? ? ?Your next appointment:   ?6 month(s) ? ?The format for your next appointment:   ?In Person ? ?Provider:   ?[name], MD   ? ? ?Important Information About Sugar ? ? ? ? ?  ?

## 2022-01-16 ENCOUNTER — Ambulatory Visit (HOSPITAL_COMMUNITY): Payer: Federal, State, Local not specified - PPO | Attending: Cardiovascular Disease

## 2022-01-16 ENCOUNTER — Ambulatory Visit (INDEPENDENT_AMBULATORY_CARE_PROVIDER_SITE_OTHER): Payer: Federal, State, Local not specified - PPO

## 2022-01-16 DIAGNOSIS — R072 Precordial pain: Secondary | ICD-10-CM

## 2022-01-16 DIAGNOSIS — I10 Essential (primary) hypertension: Secondary | ICD-10-CM | POA: Insufficient documentation

## 2022-01-16 LAB — EXERCISE TOLERANCE TEST
Angina Index: 0
Duke Treadmill Score: 14
Estimated workload: 17
Exercise duration (min): 14 min
Exercise duration (sec): 0 s
MPHR: 160 {beats}/min
Peak HR: 150 {beats}/min
Percent HR: 93 %
RPE: 15
Rest HR: 38 {beats}/min
ST Depression (mm): 0 mm

## 2022-01-17 LAB — ECHOCARDIOGRAM COMPLETE
AR max vel: 2.44 cm2
AV Area VTI: 2.16 cm2
AV Area mean vel: 2.24 cm2
AV Mean grad: 4 mmHg
AV Peak grad: 8.4 mmHg
Ao pk vel: 1.45 m/s
Area-P 1/2: 2.99 cm2
S' Lateral: 3 cm

## 2022-01-21 DIAGNOSIS — N183 Chronic kidney disease, stage 3 unspecified: Secondary | ICD-10-CM | POA: Diagnosis not present

## 2022-03-11 DIAGNOSIS — Z79899 Other long term (current) drug therapy: Secondary | ICD-10-CM | POA: Diagnosis not present

## 2022-03-19 IMAGING — US US ABDOMEN COMPLETE
1 series · 14 of 25 positions shown · non-contrast
Comparison: None.

CLINICAL DATA: Elevated liver enzymes

EXAM:
ABDOMEN ULTRASOUND COMPLETE

[Series 1: us abdomen complete · 0.15mm/px · 14 of 68 slices shown]
[im 1/68]
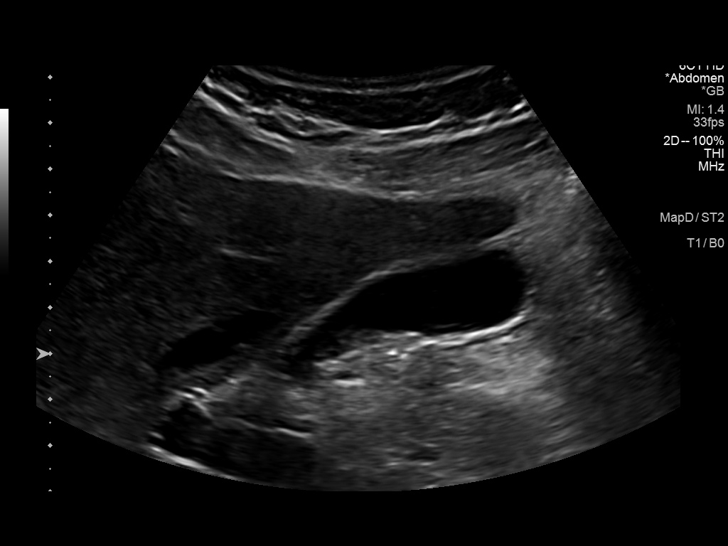
[im 6/68]
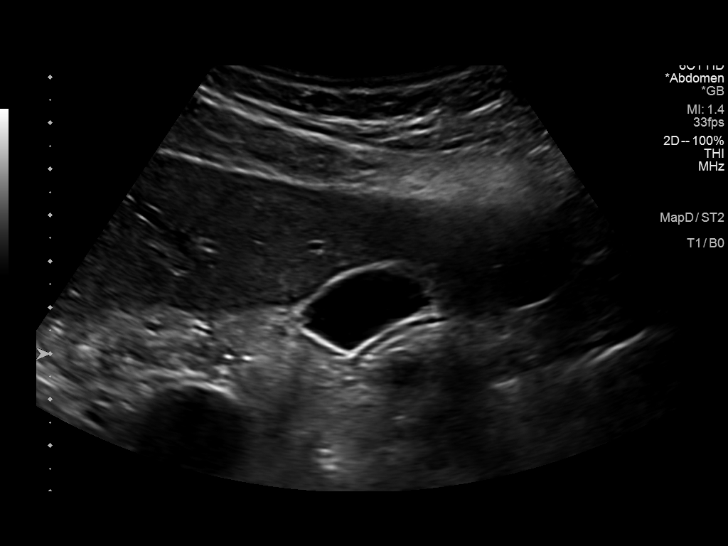
[im 12/68]
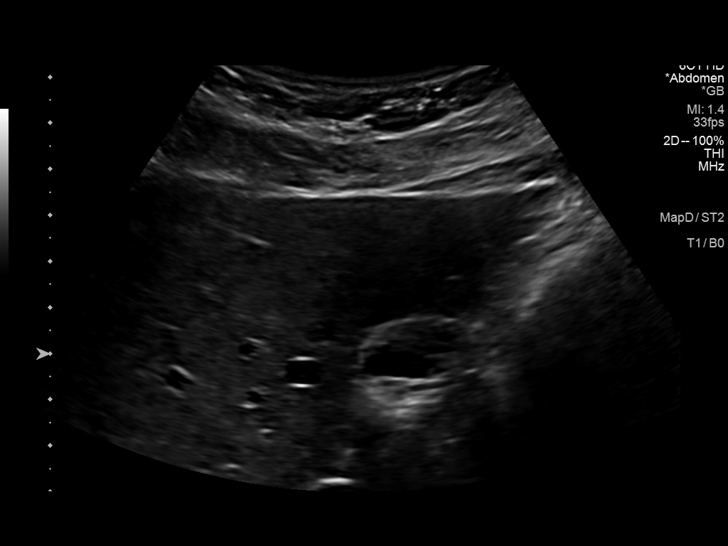
[im 17/68]
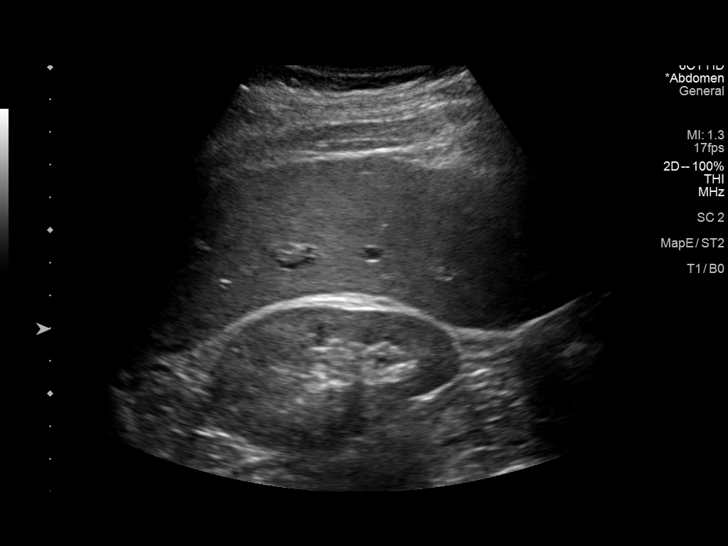
[im 23/68]
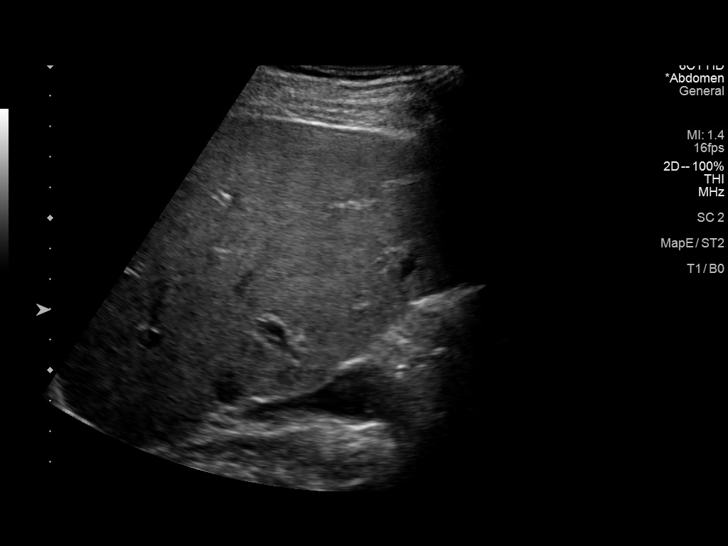
[im 26/68]
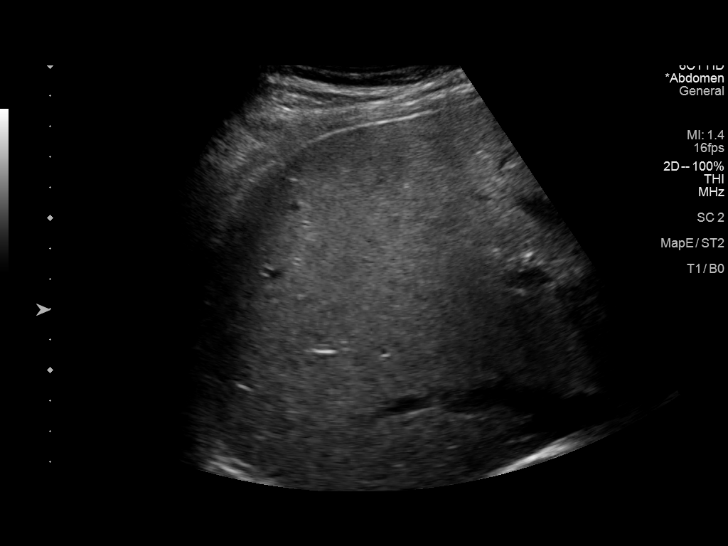
[im 31/68]
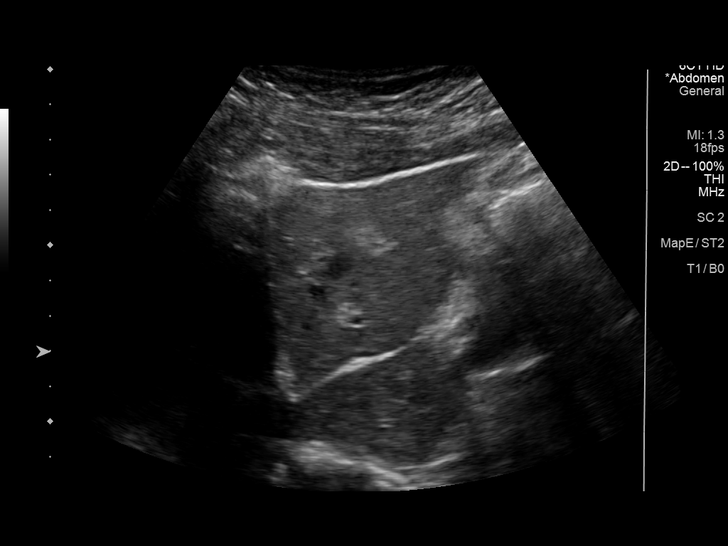
[im 37/68]
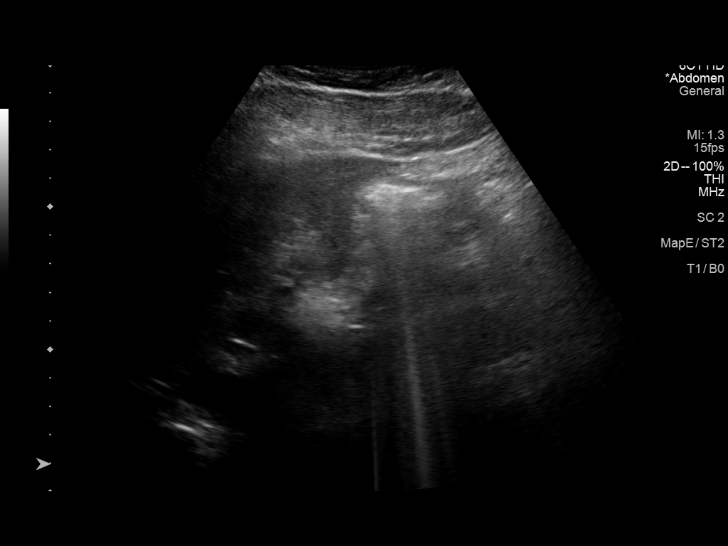
[im 42/68]
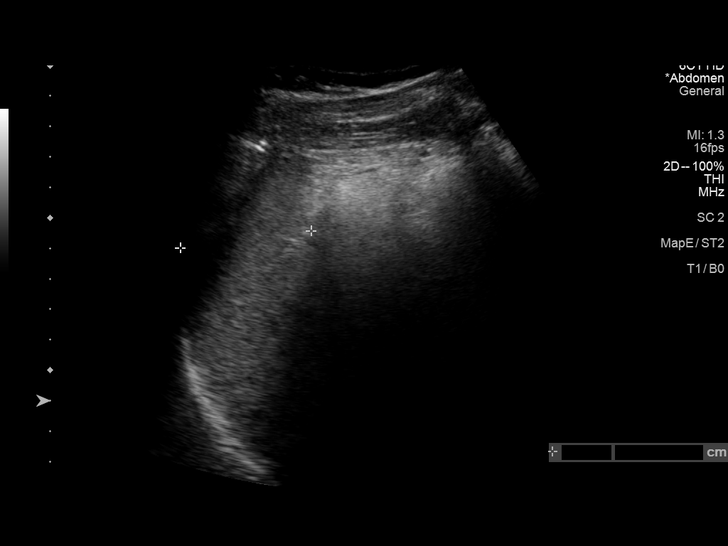
[im 45/68]
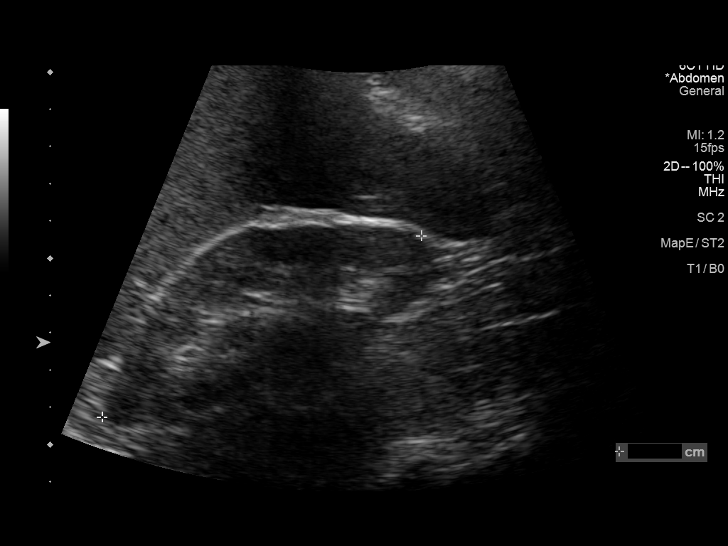
[im 51/68]
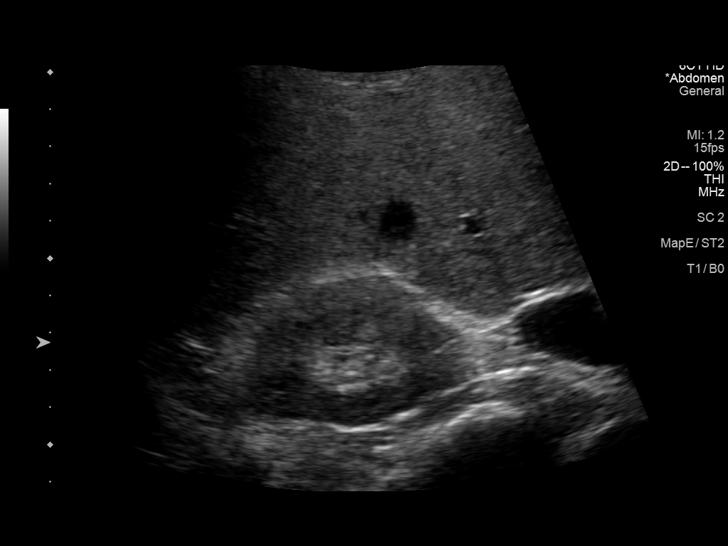
[im 56/68]
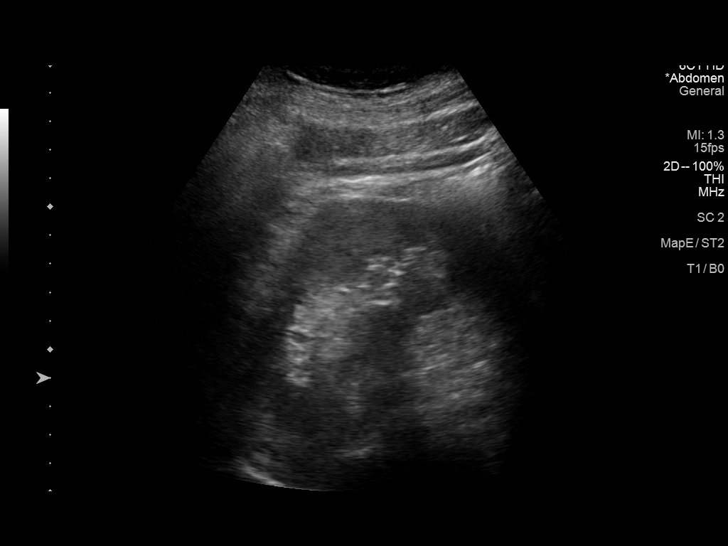
[im 62/68]
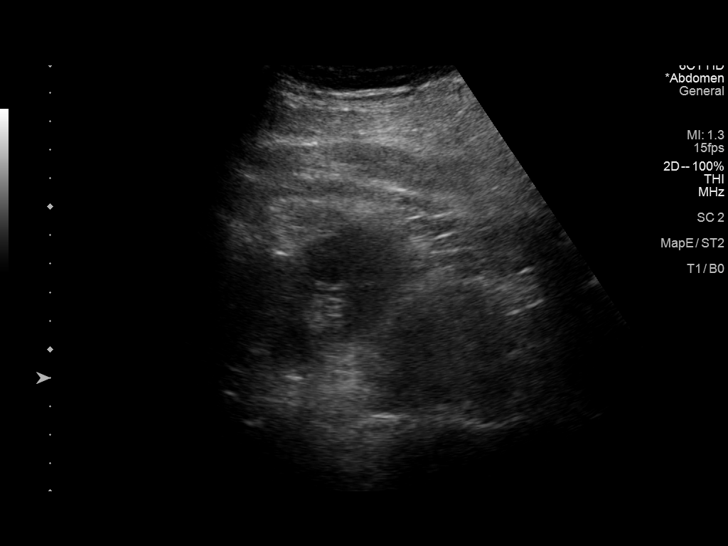
[im 68/68]
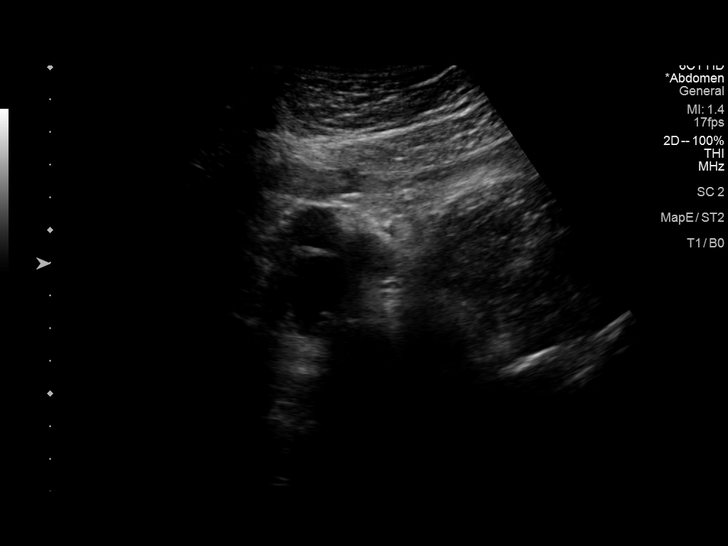

[14 of 25 positions shown; findings below may reference images not displayed]

FINDINGS: Gallbladder: No gallstones or wall thickening visualized. There is
no pericholecystic fluid. No sonographic Murphy sign noted by
sonographer.

Common bile duct: Diameter: 5 mm. No intrahepatic, common hepatic,
or common bile duct dilatation.

Liver: No focal lesion identified. Within normal limits in
parenchymal echogenicity. Portal vein is patent on color Doppler
imaging with normal direction of blood flow towards the liver.

IVC: No abnormality visualized.

Pancreas: No pancreatic mass or inflammatory focus.

Spleen: Size and appearance within normal limits.

Right Kidney: Length: 9.9 cm. Echogenicity within normal limits. No
mass or hydronephrosis visualized.

Left Kidney: Length: 11.4 cm. Echogenicity within normal limits. No
mass or hydronephrosis visualized.

Abdominal aorta: No aneurysm visualized.

Other findings: No demonstrable ascites.
IMPRESSION: Study within normal limits.

## 2022-07-28 ENCOUNTER — Ambulatory Visit: Payer: Federal, State, Local not specified - PPO | Admitting: Internal Medicine

## 2022-11-03 DIAGNOSIS — Z1211 Encounter for screening for malignant neoplasm of colon: Secondary | ICD-10-CM | POA: Diagnosis not present

## 2022-11-03 DIAGNOSIS — Z01818 Encounter for other preprocedural examination: Secondary | ICD-10-CM | POA: Diagnosis not present

## 2022-12-05 DIAGNOSIS — K649 Unspecified hemorrhoids: Secondary | ICD-10-CM | POA: Diagnosis not present

## 2022-12-05 DIAGNOSIS — Z1211 Encounter for screening for malignant neoplasm of colon: Secondary | ICD-10-CM | POA: Diagnosis not present

## 2022-12-05 DIAGNOSIS — D123 Benign neoplasm of transverse colon: Secondary | ICD-10-CM | POA: Diagnosis not present

## 2022-12-24 DIAGNOSIS — E7800 Pure hypercholesterolemia, unspecified: Secondary | ICD-10-CM | POA: Diagnosis not present

## 2022-12-24 DIAGNOSIS — Z131 Encounter for screening for diabetes mellitus: Secondary | ICD-10-CM | POA: Diagnosis not present

## 2022-12-24 DIAGNOSIS — I10 Essential (primary) hypertension: Secondary | ICD-10-CM | POA: Diagnosis not present

## 2022-12-24 DIAGNOSIS — Z0000 Encounter for general adult medical examination without abnormal findings: Secondary | ICD-10-CM | POA: Diagnosis not present

## 2022-12-24 DIAGNOSIS — Z1329 Encounter for screening for other suspected endocrine disorder: Secondary | ICD-10-CM | POA: Diagnosis not present

## 2022-12-29 DIAGNOSIS — N393 Stress incontinence (female) (male): Secondary | ICD-10-CM | POA: Diagnosis not present

## 2023-01-05 DIAGNOSIS — N5201 Erectile dysfunction due to arterial insufficiency: Secondary | ICD-10-CM | POA: Diagnosis not present

## 2023-01-05 DIAGNOSIS — N393 Stress incontinence (female) (male): Secondary | ICD-10-CM | POA: Diagnosis not present

## 2023-01-05 DIAGNOSIS — C61 Malignant neoplasm of prostate: Secondary | ICD-10-CM | POA: Diagnosis not present

## 2023-01-23 DIAGNOSIS — R7401 Elevation of levels of liver transaminase levels: Secondary | ICD-10-CM | POA: Diagnosis not present

## 2023-06-19 DIAGNOSIS — L03032 Cellulitis of left toe: Secondary | ICD-10-CM | POA: Diagnosis not present

## 2023-06-19 DIAGNOSIS — I10 Essential (primary) hypertension: Secondary | ICD-10-CM | POA: Diagnosis not present

## 2023-06-19 NOTE — Progress Notes (Signed)
 [facility] Provider Note  Date seen: 06/19/2023 Time seen: 11:52 AM  I have reviewed the triage vital signs and the nursing notes. Chief Complaint   Chief Complaint  Patient presents with  . Toe Pain    Over 1 week; L toe pain and swelling. Denies injury to area     HPI  Patrick Edwards is a 62 y.o. male who presents with L great toe pain x >1 week. Pt noticed pain, redness and swelling around his nailbed. He runs about 6 miles a few times a week for exercise, but denies injury. He has not noticed any discharge. No other complaints.  History provided by self.  History   PAST MEDICAL HISTORY/PAST SURGICAL HISTORY:  No past medical history on file.  MEDICATIONS:   Current Outpatient Medications:  .  atorvastatin (LIPITOR) 10 MG tablet, Take 10 mg by mouth once daily, Disp: , Rfl:  .  bisacodyL  (DULCOLAX) 5 mg EC tablet, as directed, Disp: , Rfl:  .  hydrochlorothiazide  (HYDRODIURIL ) 25 MG tablet, Take 25 mg by mouth as directed., Disp: , Rfl:  .  irbesartan (AVAPRO) 300 MG tablet, Take 300 mg by mouth once daily, Disp: , Rfl:  .  sildenafiL (VIAGRA) 100 MG tablet, Take 100 mg by mouth once daily as needed, Disp: , Rfl:  .  cephalexin (KEFLEX) 500 MG capsule, Take 1 capsule (500 mg total) by mouth 2 (two) times daily for 7 days, Disp: 14 capsule, Rfl: 0 .  lisinopriL  (ZESTRIL ) 5 MG tablet, Take 5 mg by mouth once daily (Patient not taking: Reported on 06/19/2023), Disp: , Rfl:  .  multivitamin tablet, Take 1 tablet by mouth once daily (Patient not taking: Reported on 06/19/2023), Disp: , Rfl:  .  mupirocin (BACTROBAN) 2 % ointment, Apply topically 2 (two) times daily for 7 days, Disp: 1 g, Rfl: 0  ALLERGIES:  Patient has no known allergies.  SOCIAL HISTORY:  Social History   Socioeconomic History  . Marital status: Married  Tobacco Use  . Smoking status: Never  . Smokeless tobacco: Never  Vaping Use  . Vaping status: Never Used  Substance and Sexual Activity   . Alcohol use: No  . Drug use: No    FAMILY HISTORY: Family History  Problem Relation Name Age of Onset  . Prostate cancer Father    . Prostate cancer Other    . Glaucoma Paternal Grandmother    . Macular degeneration Neg Hx        ROS   Refer to pertinent negatives in HPI above.  Objective  BP (!) 156/81   Pulse (!) 49   Temp 36.9 C (98.4 F) (Oral)   Resp 16   Ht 174 cm (5' 8.5)   Wt 87 kg (191 lb 12.8 oz)   SpO2 100%   BMI 28.74 kg/m   Physical Exam Vitals and nursing note reviewed.  Constitutional:      General: He is not in acute distress.    Appearance: Normal appearance. He is well-developed. He is not ill-appearing, toxic-appearing or diaphoretic.  HENT:     Head: Normocephalic and atraumatic.  Pulmonary:     Effort: Pulmonary effort is normal.  Musculoskeletal:        General: Normal range of motion.       Feet:     Comments: Localized area of mild fluctuance with minimal swelling, erythema noted around nailbed. No discharge or pustule. No tenderness. Joint strength intact in all  fields. No pulp tenderness or felon. No streaking erythema.   Skin:    General: Skin is warm.  Neurological:     General: No focal deficit present.     Mental Status: He is alert and oriented to person, place, and time. Mental status is at baseline.  Psychiatric:        Mood and Affect: Mood normal.      Procedure Informed Consent: Risks, benefits and alternatives discussed. Time Out: Performed, cross checking patient ID, procedure, and process. Preparation: Cleaned and prepped in the usual fashion with alcohol pad. Anaesthesia: PainEase spray Incision/Procedure:  11 blade used to create draining site. Drainage: 2cc of pus fluid was expressed. Loculations: Exploration deferred given low suspicion for loculations.  Packing/Dressing: Wound was covered with Bacitracin ointment and loosely with a bandage.   Estimated blood loss less than 1 cc, patient tolerated the  procedure well, no complications.   Data   Labs/Imaging:   No results found for this visit on 06/19/23.     Assessment and Plan:     ICD-10-CM  1. Paronychia of great toe of left foot  L03.032  2. Essential hypertension  I10    I&D procedure performed as detailed above without complication. No signs or symptoms of systemic infection.   BP Readings from Last 3 Encounters:  06/19/23 (!) 156/81   Discussed pt's elevated BP here today - asymptomatic for neuro or cardiac sx; has hx of, takes medications, agreed to f/u with his PCP.  Plan: Keflex 500mg  BID x 7 days along with Mupirocin ointment Apply frequent warm compresses to promote drainage.  Follow up if developing increased swelling, drainage, or pain. Return sooner for red streaking, new or worse fever / bodyaches, increasing redness, or drainage.   Requested Prescriptions   Signed Prescriptions Disp Refills  . cephalexin (KEFLEX) 500 MG capsule 14 capsule 0    Sig: Take 1 capsule (500 mg total) by mouth 2 (two) times daily for 7 days  . mupirocin (BACTROBAN) 2 % ointment 1 g 0    Sig: Apply topically 2 (two) times daily for 7 days     Mi-Young Um, PA-C 06/19/2023

## 2023-07-12 IMAGING — RF DG KNEE 1-2V*L*
1 series · 6 of 6 positions shown · non-contrast
Comparison: None.

CLINICAL DATA: Medial meniscectomy and medial femur
subchondroplasty

EXAM:
LEFT KNEE - 1-2 VIEW

[Series 1: run · 6 of 6 slices shown]
[im 1/6]
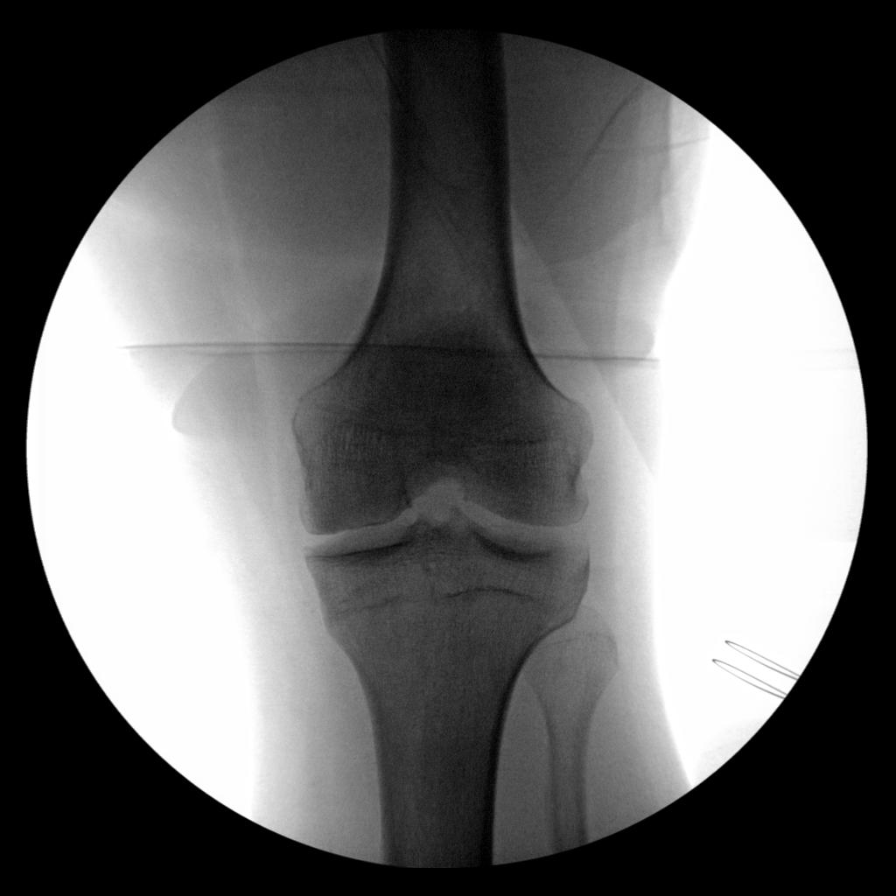
[im 2/6]
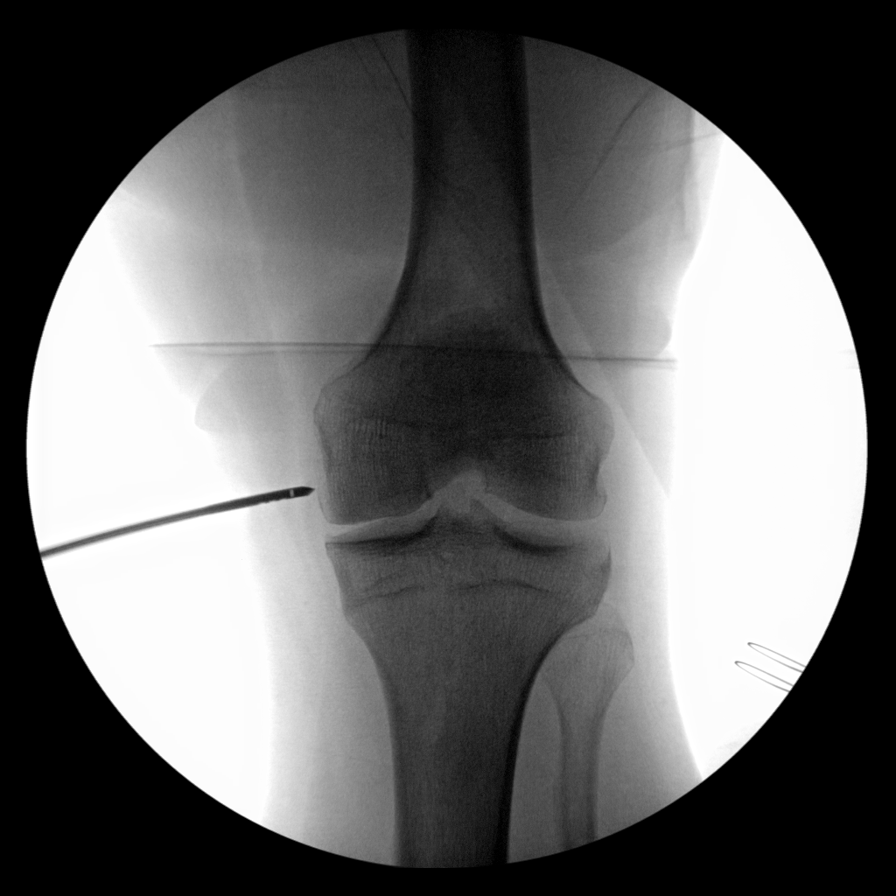
[im 3/6]
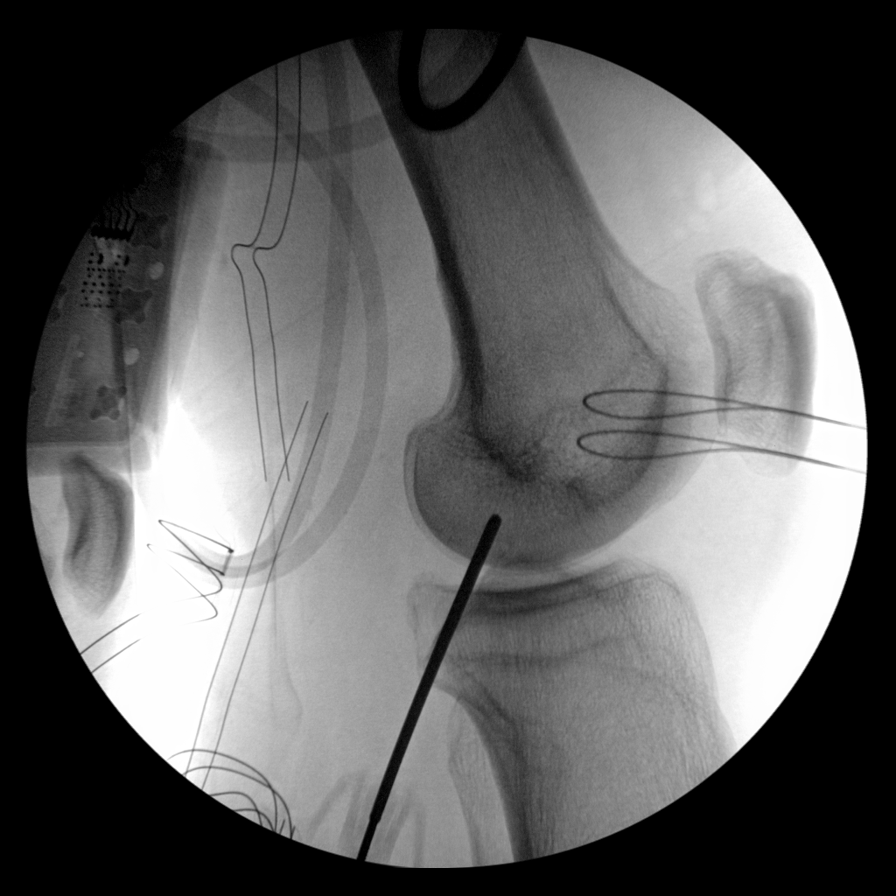
[im 4/6]
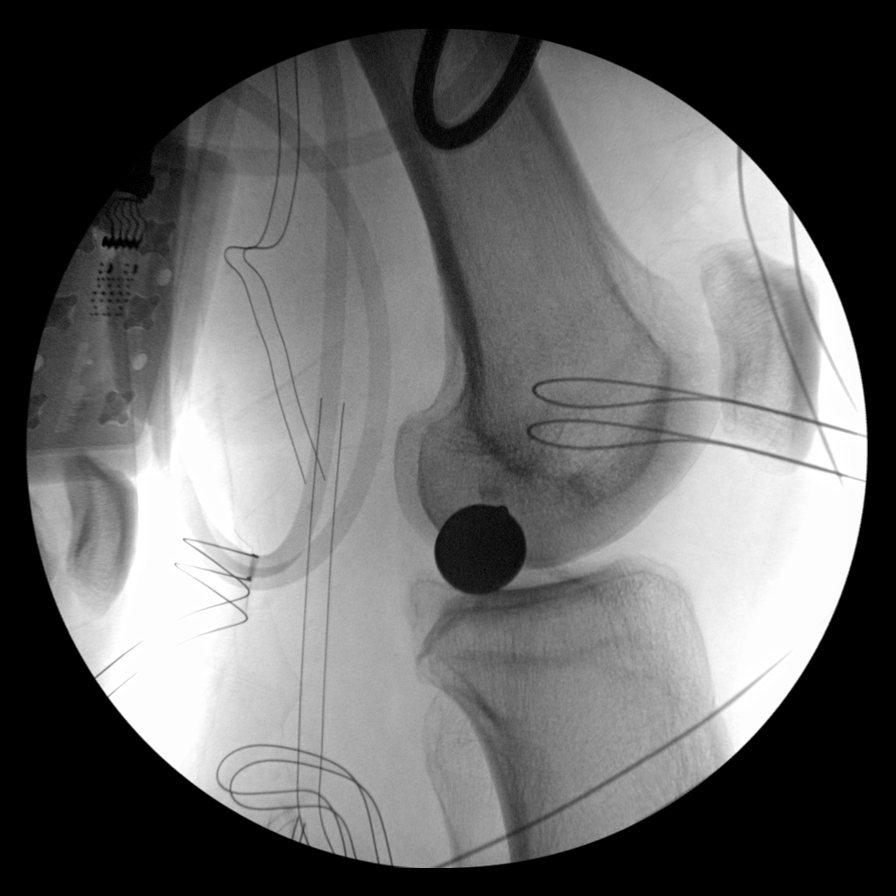
[im 5/6]
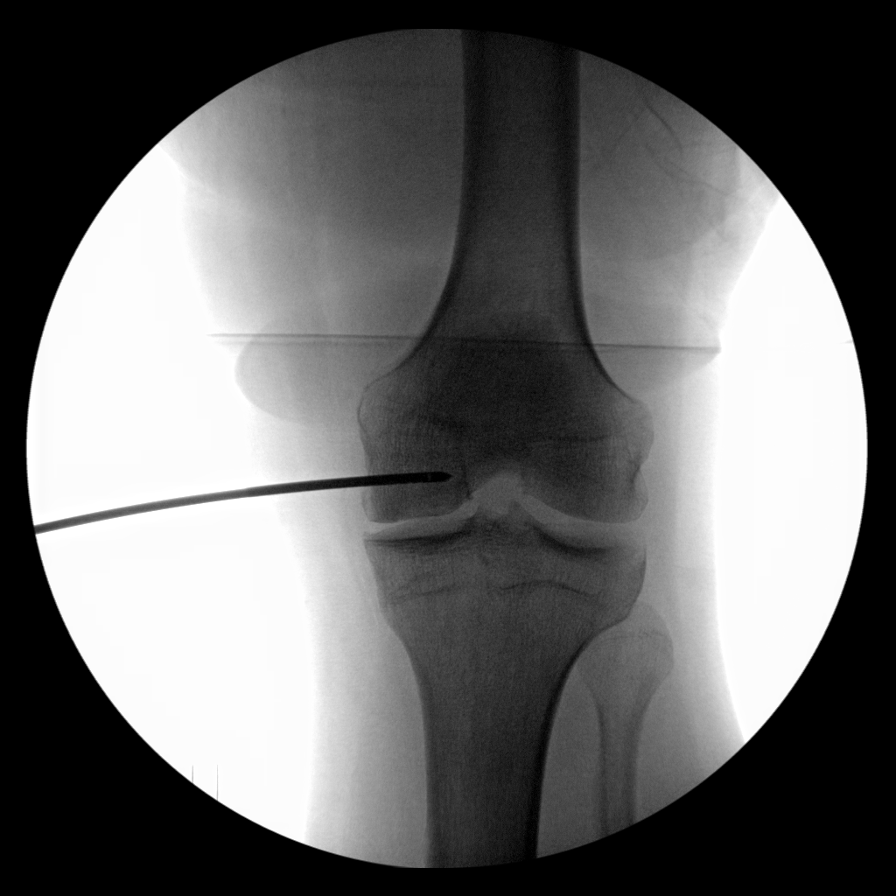
[im 6/6]
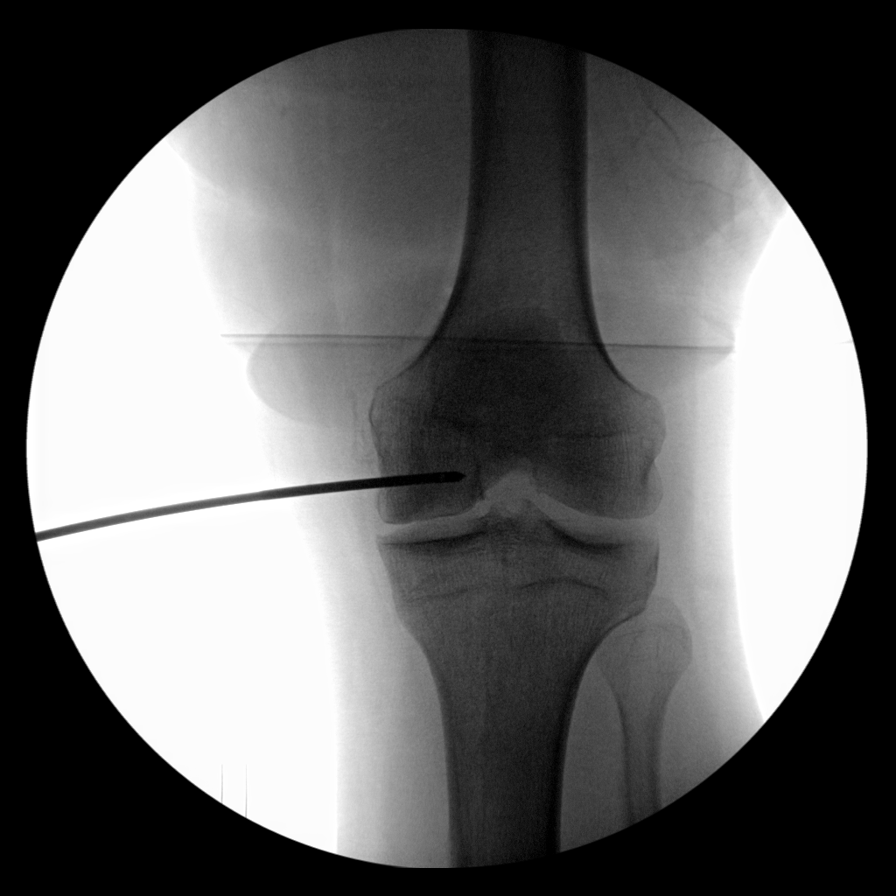

[6 of 6 positions shown; findings below may reference images not displayed]

FINDINGS: Spot fluoroscopy images were obtained for surgical planning
purposes. Orthopedic hardware is visualized along the medial femoral
condyle for purposes of medial meniscectomy and medial femoral sub
chondroplasty. Fluoroscopy time 10 seconds.
IMPRESSION: Spot fluoroscopy images were obtained for surgical planning
purposes.

## 2023-11-05 NOTE — Progress Notes (Signed)
 Dental Hygiene Clinical Note  Procedure Date 11/05/2023   Dental procedures in this visit  . I8889 - PROPHYLAXIS - ADULT (Completed)    Service provider: Cleotilde Maxwell    Billing provider: Camillia Harpin, DDS  . I9879 - Periodic oral evaluation - established patient (Completed)    Service provider: Camillia Harpin, DDS    Billing provider: Astorga, Fernando, DDS  . I8669 - PR ORAL HYGIENE INSTRUCTIONS (Completed)    Service provider: Cleotilde Maxwell    Billing provider: Camillia Harpin, DDS    Health History Considerations None  Had knee surgery (meniscus) 3 years ago.    Allergies  Patient has no known allergies.   Medications   Current Outpatient Medications:  .  atorvastatin (LIPITOR) 10 MG tablet, Take 1 tablet (10 mg total) by mouth daily., Disp: , Rfl:  .  hydroCHLOROthiazide  (HYDRODIURIL ) 25 MG tablet, Take 1 tablet (25 mg total) by mouth daily., Disp: , Rfl:  .  irbesartan (AVAPRO) 150 MG tablet, Take 1 tablet by mouth once daily for 90 days, Disp: , Rfl:  .  sildenafiL (VIAGRA) 100 MG tablet, Take by mouth. (Patient not taking: Reported on 11/05/2023), Disp: , Rfl:    Vitals  BP 167/107 (BP Site: R Arm, BP Position: Sitting)   Pulse (!) 42    Chief Complaint  None    Extraoral Exam  Overall appraisal: Within normal Limits (WNL) Head and neck: WNL Glands: WNL TMJ: WNL [place]: WNL   INTRAORAL EXAM  Soft Tissue  Assessment  Palate and pharynx: Within normal limits (WNL) Labial and buccal mucosa: bilateral linea alba Alveolar bone: WNL Tongue: coated Floor of the mouth: WNL Oral cancer screening (+/-): negative   Gingival  Description  coral pink, bulbous interdental papilla, rolled margins, resillient, stippled   Occlusal  Analysis   Right molar: Class I Right canine: Class I Left molar: Class I Left canine: Class I Overbite: slight Crossbite: N/A Overjet: 2mm Midline: left 2mm Malpositioning of teeth: N/A   Hard Tissue   Assessment  Abfraction: N/A Abrasion: N/A Erosion: N/A Fractures: N/A Attrition: N/A Craze Lines: N/A Other: Peg laterals    Periodontal  Summary  Bone loss:  Normal Bone Height Bleeding on probing: 4.5% Estimated plaque index (%): ~50%   Implant  Classification  Not Applicable   Periodontal Classification  Localized gingivitis around mandibular anteriors   Calculus Rating  01     OHI  Recommended Brushing using the modified bass technique, also recommended flossing and hugging each tooth. Patient does not floss, so made a goal to begin flossing every other day, and improving from there.    Treatment  Performed health history update, EOE/IOE, full periodontal charting, ultrasonic scaling, hand scaling, coronal polishing with Fine grit prophy paste, and flossing.      Post Treatment  Instructions  None    Return to Clinic  6 Month Recall   Maxwell Cleotilde, DH2 Student   Periodic Oral Evaluation Completed by DDS3/4 student, Marry Gold, supervised by Dr. Camillia. Oral cancer screening: Negative Clinical and radiographic findings: Chip on #83MF Recommended treatment: Filling on #83M Referrals: [facility] 3rd year Next appointment: Filling on #83M

## 2024-01-15 DIAGNOSIS — C61 Malignant neoplasm of prostate: Secondary | ICD-10-CM | POA: Diagnosis not present

## 2024-01-22 DIAGNOSIS — C61 Malignant neoplasm of prostate: Secondary | ICD-10-CM | POA: Diagnosis not present

## 2024-01-22 DIAGNOSIS — N5201 Erectile dysfunction due to arterial insufficiency: Secondary | ICD-10-CM | POA: Diagnosis not present

## 2024-01-22 DIAGNOSIS — N393 Stress incontinence (female) (male): Secondary | ICD-10-CM | POA: Diagnosis not present

## 2024-02-22 DIAGNOSIS — Z0000 Encounter for general adult medical examination without abnormal findings: Secondary | ICD-10-CM | POA: Diagnosis not present

## 2024-02-22 DIAGNOSIS — E7800 Pure hypercholesterolemia, unspecified: Secondary | ICD-10-CM | POA: Diagnosis not present

## 2024-02-22 DIAGNOSIS — I10 Essential (primary) hypertension: Secondary | ICD-10-CM | POA: Diagnosis not present

## 2024-02-22 DIAGNOSIS — Z23 Encounter for immunization: Secondary | ICD-10-CM | POA: Diagnosis not present

## 2024-03-07 DIAGNOSIS — I10 Essential (primary) hypertension: Secondary | ICD-10-CM | POA: Diagnosis not present

## 2024-05-10 DIAGNOSIS — I10 Essential (primary) hypertension: Secondary | ICD-10-CM | POA: Diagnosis not present

## 2024-05-10 DIAGNOSIS — Z23 Encounter for immunization: Secondary | ICD-10-CM | POA: Diagnosis not present

## 2024-05-17 DIAGNOSIS — I10 Essential (primary) hypertension: Secondary | ICD-10-CM | POA: Diagnosis not present

## 2024-05-30 DIAGNOSIS — G4730 Sleep apnea, unspecified: Secondary | ICD-10-CM | POA: Diagnosis not present

## 2024-05-30 DIAGNOSIS — I10 Essential (primary) hypertension: Secondary | ICD-10-CM | POA: Diagnosis not present

## 2024-06-13 DIAGNOSIS — G4733 Obstructive sleep apnea (adult) (pediatric): Secondary | ICD-10-CM | POA: Diagnosis not present

## 2024-06-20 DIAGNOSIS — G4733 Obstructive sleep apnea (adult) (pediatric): Secondary | ICD-10-CM | POA: Diagnosis not present

## 2024-06-20 DIAGNOSIS — I10 Essential (primary) hypertension: Secondary | ICD-10-CM | POA: Diagnosis not present

## 2024-07-05 DIAGNOSIS — G4733 Obstructive sleep apnea (adult) (pediatric): Secondary | ICD-10-CM | POA: Diagnosis not present

## 2024-07-13 DIAGNOSIS — I129 Hypertensive chronic kidney disease with stage 1 through stage 4 chronic kidney disease, or unspecified chronic kidney disease: Secondary | ICD-10-CM | POA: Diagnosis not present

## 2024-07-13 DIAGNOSIS — E785 Hyperlipidemia, unspecified: Secondary | ICD-10-CM | POA: Diagnosis not present

## 2024-07-13 DIAGNOSIS — N1831 Chronic kidney disease, stage 3a: Secondary | ICD-10-CM | POA: Diagnosis not present

## 2024-07-13 DIAGNOSIS — Z8546 Personal history of malignant neoplasm of prostate: Secondary | ICD-10-CM | POA: Diagnosis not present

## 2024-07-15 ENCOUNTER — Other Ambulatory Visit: Payer: Self-pay | Admitting: Nephrology

## 2024-07-15 DIAGNOSIS — I129 Hypertensive chronic kidney disease with stage 1 through stage 4 chronic kidney disease, or unspecified chronic kidney disease: Secondary | ICD-10-CM

## 2024-07-15 DIAGNOSIS — N1831 Chronic kidney disease, stage 3a: Secondary | ICD-10-CM

## 2024-07-19 DIAGNOSIS — G4733 Obstructive sleep apnea (adult) (pediatric): Secondary | ICD-10-CM | POA: Diagnosis not present

## 2024-07-22 ENCOUNTER — Ambulatory Visit
Admission: RE | Admit: 2024-07-22 | Discharge: 2024-07-22 | Disposition: A | Discharge status: Home / Self Care | Source: Ambulatory Visit | Attending: Nephrology | Admitting: Nephrology

## 2024-07-22 DIAGNOSIS — N1831 Chronic kidney disease, stage 3a: Secondary | ICD-10-CM | POA: Insufficient documentation

## 2024-07-22 DIAGNOSIS — I129 Hypertensive chronic kidney disease with stage 1 through stage 4 chronic kidney disease, or unspecified chronic kidney disease: Secondary | ICD-10-CM | POA: Insufficient documentation

## 2024-07-22 DIAGNOSIS — N183 Chronic kidney disease, stage 3 unspecified: Secondary | ICD-10-CM | POA: Diagnosis not present

## 2024-08-16 DIAGNOSIS — G4733 Obstructive sleep apnea (adult) (pediatric): Secondary | ICD-10-CM | POA: Diagnosis not present

## 2024-08-17 DIAGNOSIS — G4733 Obstructive sleep apnea (adult) (pediatric): Secondary | ICD-10-CM | POA: Diagnosis not present
